# Patient Record
Sex: Female | Born: 1960
Health system: Southern US, Community
[De-identification: ages and names within clinical notes are randomized; demographics above are authoritative.]

## PROBLEM LIST (undated history)

## (undated) DIAGNOSIS — C3490 Malignant neoplasm of unspecified part of unspecified bronchus or lung: Secondary | ICD-10-CM

## (undated) DIAGNOSIS — I10 Essential (primary) hypertension: Secondary | ICD-10-CM

## (undated) DIAGNOSIS — J189 Pneumonia, unspecified organism: Secondary | ICD-10-CM

## (undated) DIAGNOSIS — J42 Unspecified chronic bronchitis: Secondary | ICD-10-CM

## (undated) DIAGNOSIS — J449 Chronic obstructive pulmonary disease, unspecified: Secondary | ICD-10-CM

## (undated) DIAGNOSIS — J939 Pneumothorax, unspecified: Secondary | ICD-10-CM

## (undated) HISTORY — PX: TONSILLECTOMY: SUR1361

## (undated) HISTORY — PX: CHEST TUBE INSERTION: SHX231

## (undated) HISTORY — PX: LUMBAR DISC SURGERY: SHX700

## (undated) HISTORY — PX: BACK SURGERY: SHX140

---

## 1998-12-16 ENCOUNTER — Ambulatory Visit (HOSPITAL_BASED_OUTPATIENT_CLINIC_OR_DEPARTMENT_OTHER): Admission: RE | Admit: 1998-12-16 | Discharge: 1998-12-16 | Payer: Self-pay | Admitting: *Deleted

## 2001-10-06 ENCOUNTER — Encounter: Admission: RE | Admit: 2001-10-06 | Discharge: 2001-10-06 | Payer: Self-pay | Admitting: *Deleted

## 2001-10-06 ENCOUNTER — Encounter: Payer: Self-pay | Admitting: *Deleted

## 2003-09-21 ENCOUNTER — Emergency Department (HOSPITAL_COMMUNITY): Admission: EM | Admit: 2003-09-21 | Discharge: 2003-09-21 | Payer: Self-pay

## 2003-09-22 ENCOUNTER — Emergency Department (HOSPITAL_COMMUNITY): Admission: AD | Admit: 2003-09-22 | Discharge: 2003-09-22 | Payer: Self-pay | Admitting: Family Medicine

## 2003-09-23 ENCOUNTER — Emergency Department (HOSPITAL_COMMUNITY): Admission: AD | Admit: 2003-09-23 | Discharge: 2003-09-23 | Payer: Self-pay | Admitting: Family Medicine

## 2003-09-24 ENCOUNTER — Emergency Department (HOSPITAL_COMMUNITY): Admission: AD | Admit: 2003-09-24 | Discharge: 2003-09-24 | Payer: Self-pay | Admitting: Family Medicine

## 2005-08-13 ENCOUNTER — Emergency Department (HOSPITAL_COMMUNITY): Admission: EM | Admit: 2005-08-13 | Discharge: 2005-08-13 | Payer: Self-pay | Admitting: Family Medicine

## 2006-04-21 ENCOUNTER — Ambulatory Visit (HOSPITAL_COMMUNITY): Admission: RE | Admit: 2006-04-21 | Discharge: 2006-04-22 | Payer: Self-pay | Admitting: Neurosurgery

## 2009-08-08 ENCOUNTER — Other Ambulatory Visit: Admission: RE | Admit: 2009-08-08 | Discharge: 2009-08-08 | Payer: Self-pay | Admitting: Family Medicine

## 2009-08-26 ENCOUNTER — Encounter: Admission: RE | Admit: 2009-08-26 | Discharge: 2009-08-26 | Payer: Self-pay | Admitting: Family Medicine

## 2011-02-19 NOTE — Op Note (Signed)
NAME:  Aimee Lewis, Aimee Lewis NO.:  [id number]   MEDICAL RECORD NO.:  [id number]          PATIENT TYPE:  OIB   LOCATION:  3019                         FACILITY:  [facility]   PHYSICIAN:  Hewitt Shorts, M.D.DATE OF BIRTH:  1961-05-23   DATE OF PROCEDURE:  DATE OF DISCHARGE:                                 OPERATIVE REPORT   PREOPERATIVE DIAGNOSIS:  Left L3-4 extraforaminal lumbar disk herniation.  Lumbar degenerative disk disease.  Lumbar spondylosis.  Lumbar scoliosis and  lumbar radiculopathy.   POSTOPERATIVE DIAGNOSIS:  Left L3-4 extraforaminal lumbar disk herniation.  Lumbar degenerative disk disease.  Lumbar spondylosis.  Lumbar scoliosis and  lumbar radiculopathy.   PROCEDURE:  Left L3-4 extraforaminal microdiskectomy with microdissection.   SURGEON:  Hewitt Shorts, M.D.   Walker ShadowDanielle Dess and [name].   INDICATIONS FOR PROCEDURE:  The patient is a 50 year old woman who presented  with a left lumbar radiculopathy.  She was found to have a large left L3-4  lumbar disk herniation located extraforaminally at L3-4 with significant  compression of the left L3 nerve root. Decision was made to proceed with  extraforaminal microdiskectomy.   PROCEDURE:  The patient was brought to the operating room and placed under  general endotracheal anesthesia.  The patient was turned to a prone  position, lumbar region was prepped with beta and saline solution and draped  in sterile fashion.  The midline was infiltrated with local anesthetic with  epinephrine.  It was actually taken to the L3-4 level and identified and the  midline incision was made and carried down to the subcutaneous tissue.  Bipolar cautery, electrocautery was used to obtain hemostasis.  Dissection  was carried down to the lumbar fascia which was incised on the left side of  the midline to the paraspinous muscles with dissection of the spinous  process of lamina, subperiosteal fashion.  Self-retaining  retractor was  placed and dissection was carried out over the facet complexes laterally and  x-rays taken.  The initial localization was at the L4-5 level and therefore  we dissected up to the L3-4 level and exposed the extraforaminal space.  We  identified the transverse process of L3 and L4.  There was mild splaying of  the left L3-4 facet complex and the microscope was draped and brought into  the field to provide additional magnification, illumination and  visualization.  The decompression was performed using microdissection and  microsurgical technique.  A partial superolateral facetectomy was performed.  The intertransverse muscle and fascial tissues was carefully removed and we  identified the exiting left L3 nerve root which appeared bowed posteriorly  and under compression from the central aspect.  We then dissected  inferomedially exposing the disk herniation which was compressing the left  L3 nerve root.  We incised the remaining fascial fibers over the disk  fragment and we removed several large fragments of disk material and then  carefully dissected with a microhook removing and immobilizing other loose  fragments of disk material.  All of these fragments of disk herniation were  removed and good decompression of the left L3 nerve root  in the  extraforaminal space was achieved.  The wound was irrigated on several  occasions with bacitracin solution.  It was checked for hemostasis and was  established using bipolar cautery as well as Gelfoam soaked in thrombin.  Once the diskectomy was completed the hemostasis was established we  instilled 2 cc of fentanyl and 80 mg of Depo-Medrol into the extraforaminal  space around the dorsal ganglion and nerve root and then proceeded with  closure. The deep fascia was closed with interrupted undyed 1 Vicryl suture,  the subcutaneous and subcuticular were closed with interrupted inverted 2-0  and 3-0 Vicryl sutures  and the skin was  reapproximated with Dermabond.  The procedure was tolerated  well.  Estimated blood loss was less than 50 cc.  Sponge count correct.  Following surgery the patient was turned back to supine position to be  reversed of anesthetic, extubated and transferred to the  recovery room for  further care.      Hewitt Shorts, M.D.  Electronically Signed     RWN/MEDQ  D:  04/21/2006  T:  04/22/2006  Job:  [id number]

## 2011-09-14 ENCOUNTER — Other Ambulatory Visit (HOSPITAL_COMMUNITY)
Admission: RE | Admit: 2011-09-14 | Discharge: 2011-09-14 | Disposition: A | Discharge status: Home / Self Care | Payer: 59 | Source: Ambulatory Visit | Attending: Family Medicine | Admitting: Family Medicine

## 2011-09-14 ENCOUNTER — Other Ambulatory Visit: Payer: Self-pay | Admitting: Family Medicine

## 2011-09-14 DIAGNOSIS — Z124 Encounter for screening for malignant neoplasm of cervix: Secondary | ICD-10-CM | POA: Insufficient documentation

## 2011-09-14 DIAGNOSIS — Z1159 Encounter for screening for other viral diseases: Secondary | ICD-10-CM | POA: Insufficient documentation

## 2013-10-09 ENCOUNTER — Other Ambulatory Visit: Payer: Self-pay

## 2013-10-09 DIAGNOSIS — Z1231 Encounter for screening mammogram for malignant neoplasm of breast: Secondary | ICD-10-CM

## 2013-10-11 ENCOUNTER — Ambulatory Visit
Admission: RE | Admit: 2013-10-11 | Discharge: 2013-10-11 | Disposition: A | Discharge status: Home / Self Care | Payer: Self-pay | Source: Ambulatory Visit

## 2013-10-11 DIAGNOSIS — Z1231 Encounter for screening mammogram for malignant neoplasm of breast: Secondary | ICD-10-CM

## 2013-10-15 ENCOUNTER — Other Ambulatory Visit (HOSPITAL_COMMUNITY)
Admission: RE | Admit: 2013-10-15 | Discharge: 2013-10-15 | Disposition: A | Discharge status: Home / Self Care | Payer: 59 | Source: Ambulatory Visit | Attending: Family Medicine | Admitting: Family Medicine

## 2013-10-15 ENCOUNTER — Other Ambulatory Visit: Payer: Self-pay | Admitting: Family Medicine

## 2013-10-15 DIAGNOSIS — Z1151 Encounter for screening for human papillomavirus (HPV): Secondary | ICD-10-CM | POA: Insufficient documentation

## 2013-10-15 DIAGNOSIS — Z124 Encounter for screening for malignant neoplasm of cervix: Secondary | ICD-10-CM | POA: Insufficient documentation

## 2014-08-10 ENCOUNTER — Emergency Department (HOSPITAL_COMMUNITY)
Admission: EM | Admit: 2014-08-10 | Discharge: 2014-08-10 | Disposition: A | Discharge status: Home / Self Care | Payer: 59 | Attending: Emergency Medicine | Admitting: Emergency Medicine

## 2014-08-10 ENCOUNTER — Emergency Department (HOSPITAL_COMMUNITY): Payer: 59

## 2014-08-10 ENCOUNTER — Encounter (HOSPITAL_COMMUNITY): Payer: Self-pay | Admitting: Emergency Medicine

## 2014-08-10 DIAGNOSIS — Y9389 Activity, other specified: Secondary | ICD-10-CM | POA: Diagnosis not present

## 2014-08-10 DIAGNOSIS — W010XXA Fall on same level from slipping, tripping and stumbling without subsequent striking against object, initial encounter: Secondary | ICD-10-CM

## 2014-08-10 DIAGNOSIS — Z720 Tobacco use: Secondary | ICD-10-CM | POA: Insufficient documentation

## 2014-08-10 DIAGNOSIS — S4991XA Unspecified injury of right shoulder and upper arm, initial encounter: Secondary | ICD-10-CM | POA: Insufficient documentation

## 2014-08-10 DIAGNOSIS — S2231XA Fracture of one rib, right side, initial encounter for closed fracture: Secondary | ICD-10-CM | POA: Diagnosis not present

## 2014-08-10 DIAGNOSIS — R52 Pain, unspecified: Secondary | ICD-10-CM

## 2014-08-10 DIAGNOSIS — Z79899 Other long term (current) drug therapy: Secondary | ICD-10-CM | POA: Diagnosis not present

## 2014-08-10 DIAGNOSIS — W109XXA Fall (on) (from) unspecified stairs and steps, initial encounter: Secondary | ICD-10-CM | POA: Diagnosis not present

## 2014-08-10 DIAGNOSIS — Y92009 Unspecified place in unspecified non-institutional (private) residence as the place of occurrence of the external cause: Secondary | ICD-10-CM | POA: Diagnosis not present

## 2014-08-10 DIAGNOSIS — E119 Type 2 diabetes mellitus without complications: Secondary | ICD-10-CM | POA: Insufficient documentation

## 2014-08-10 DIAGNOSIS — S3992XA Unspecified injury of lower back, initial encounter: Secondary | ICD-10-CM | POA: Diagnosis not present

## 2014-08-10 MED ORDER — ONDANSETRON 8 MG PO TBDP: 8.0000 mg | ORAL_TABLET | Freq: Three times a day (TID) | ORAL | Status: DC | PRN

## 2014-08-10 MED ORDER — IBUPROFEN 200 MG PO TABS
400.0000 mg | ORAL_TABLET | Freq: Once | ORAL | Status: AC
  Administered 2014-08-10: 400 mg via ORAL
  Filled 2014-08-10: qty 2

## 2014-08-10 MED ORDER — ONDANSETRON 8 MG PO TBDP
8.0000 mg | ORAL_TABLET | Freq: Once | ORAL | Status: AC
  Administered 2014-08-10: 8 mg via ORAL
  Filled 2014-08-10: qty 1

## 2014-08-10 MED ORDER — HYDROCODONE-ACETAMINOPHEN 5-325 MG PO TABS: 1.0000 | ORAL_TABLET | Freq: Four times a day (QID) | ORAL | Status: DC | PRN

## 2014-08-10 MED ORDER — HYDROCODONE-ACETAMINOPHEN 5-325 MG PO TABS
2.0000 | ORAL_TABLET | Freq: Once | ORAL | Status: AC
  Administered 2014-08-10: 2 via ORAL
  Filled 2014-08-10: qty 2

## 2014-08-10 NOTE — ED Notes (Signed)
Pt from home reports fall down three steps last pm after tripping. Pt c/o R shoulder and R rib pain. Pt denies other  Injury or LOC. Pt is A&O and in NAD. Pt rates R shoulder pain 7/10 and R flank area is having muscles spasms.

## 2014-08-10 NOTE — Discharge Instructions (Signed)
It was our pleasure to provide your ER care today - we hope that you feel better.  Stay active, take full and deep breaths. Take motrin or aleve as need for pain. You may also take hydrocodone as need for pain. No driving when taking hydrocodone. Also, do not take tylenol or acetaminophen containing medication when taking hydrocodone. Take zofran as need for nausea.  Your xrays were read by our radiologist as showing:  IMPRESSION: 1. Acute displaced fracture of the posterolateral aspect of the right sixth rib. 2. No evidence of a pneumothorax or other acute cardiopulmonary process. 3. 2.9 cm bubbly sclerotic lesion centered within the right proximal humeral metadiaphysis is. In the absence of clinical symptoms this is almost certainly a benign enchondroma or other fibro-osseous Lesion.  For bony lesion proximal humerus, discuss with primary care doctor, and follow up with your doctor in 3-6 months for repeat xray.  You were given pain medication in the ER - no driving for the next 4 hours.      Rib Fracture   A rib fracture is a break or crack in one of the bones of the ribs. The ribs are a group of long, curved bones that wrap around your chest and attach to your spine. They protect your lungs and other organs in the chest cavity. A broken or cracked rib is often painful, but most do not cause other problems. Most rib fractures heal on their own over time. However, rib fractures can be more serious if multiple ribs are broken or if broken ribs move out of place and push against other structures. CAUSES   A direct blow to the chest. For example, this could happen during contact sports, a car accident, or a fall against a hard object.  Repetitive movements with high force, such as pitching a baseball or having severe coughing spells. SYMPTOMS   Pain when you breathe in or cough.  Pain when someone presses on the injured area. DIAGNOSIS  Your caregiver will perform a physical  exam. Various imaging tests may be ordered to confirm the diagnosis and to look for related injuries. These tests may include a chest X-ray, computed tomography (CT), magnetic resonance imaging (MRI), or a bone scan. TREATMENT  Rib fractures usually heal on their own in 1-3 months. The longer healing period is often associated with a continued cough or other aggravating activities. During the healing period, pain control is very important. Medication is usually given to control pain. Hospitalization or surgery may be needed for more severe injuries, such as those in which multiple ribs are broken or the ribs have moved out of place.  HOME CARE INSTRUCTIONS   Avoid strenuous activity and any activities or movements that cause pain. Be careful during activities and avoid bumping the injured rib.  Gradually increase activity as directed by your caregiver.  Only take over-the-counter or prescription medications as directed by your caregiver. Do not take other medications without asking your caregiver first.  Apply ice to the injured area for the first 1-2 days after you have been treated or as directed by your caregiver. Applying ice helps to reduce inflammation and pain.  Put ice in a plastic bag.  Place a towel between your skin and the bag.   Leave the ice on for 15-20 minutes at a time, every 2 hours while you are awake.  Perform deep breathing as directed by your caregiver. This will help prevent pneumonia, which is a common complication of a broken rib.  Your caregiver may instruct you to:  Take deep breaths several times a day.  Try to cough several times a day, holding a pillow against the injured area.  Use a device called an incentive spirometer to practice deep breathing several times a day.  Drink enough fluids to keep your urine clear or pale yellow. This will help you avoid constipation.   Do not wear a rib belt or binder. These restrict breathing, which can lead to pneumonia.   SEEK IMMEDIATE MEDICAL CARE IF:   You have a fever.   You have difficulty breathing or shortness of breath.   You develop a continual cough, or you cough up thick or bloody sputum.  You feel sick to your stomach (nausea), throw up (vomit), or have abdominal pain.   You have worsening pain not controlled with medications.  MAKE SURE YOU:  Understand these instructions.  Will watch your condition.  Will get help right away if you are not doing well or get worse. Document Released: 09/20/2005 Document Revised: 05/23/2013 Document Reviewed: 11/22/2012 [facility] Patient Information 2015 [place], [place]. This information is not intended to replace advice given to you by your health care provider. Make sure you discuss any questions you have with your health care provider.

## 2014-08-10 NOTE — ED Provider Notes (Signed)
CSN: [id number]     Arrival date & time 08/10/14  8657 History   First MD Initiated Contact with Patient 08/10/14 0732     Chief Complaint  Patient presents with  . Fall  . Flank Pain  . Shoulder Pain     (Consider location/radiation/quality/duration/timing/severity/associated sxs/prior Treatment) Patient is a 53 y.o. female presenting with fall, flank pain, and shoulder pain. The history is provided by the patient.  Fall Pertinent negatives include no chest pain, no abdominal pain, no headaches and no shortness of breath.  Flank Pain Pertinent negatives include no chest pain, no abdominal pain, no headaches and no shortness of breath.  Shoulder Pain Associated symptoms: back pain   Associated symptoms: no fever and no neck pain   pt c/o trip/slip and fall on steps at home last pm. [place] [address] onto back side. C/o right mid to upper back pain laterally. No midline pain. No neck pain. No radicular pain. No numbness/weakness. Denies any faintness or dizziness prior to fall, felt normal/fine, mechanical fall. No loc w fall. No head injury or headache. No nv. No cp or sob. No abd pain or nv. Denies extremity pain or injury. Skin intact.     Past Medical History  Diagnosis Date  . Diabetes mellitus without complication    Past Surgical History  Procedure Laterality Date  . Tonsillectomy    . Back surgery     No family history on file. History  Substance Use Topics  . Smoking status: Current Every Day Smoker -- 1.00 packs/day    Types: Cigarettes  . Smokeless tobacco: Not on file  . Alcohol Use: Yes     Comment: socially   OB History    No data available     Review of Systems  Constitutional: Negative for fever and chills.  HENT: Negative for nosebleeds.   Eyes: Negative for visual disturbance.  Respiratory: Negative for shortness of breath.   Cardiovascular: Negative for chest pain.  Gastrointestinal: Negative for vomiting and abdominal pain.  Genitourinary: Negative for  dysuria and hematuria.  Musculoskeletal: Positive for back pain. Negative for neck pain.  Skin: Negative for wound.  Neurological: Negative for syncope, weakness, numbness and headaches.  Hematological: Does not bruise/bleed easily.  Psychiatric/Behavioral: Negative for confusion.      Allergies  Codeine and Darvon  Home Medications   Prior to Admission medications   Medication Sig Start Date End Date Taking? Authorizing Provider  lisinopril (PRINIVIL,ZESTRIL) 40 MG tablet Take 40 mg by mouth daily.   Yes Historical Provider, MD   BP 144/86 mmHg  Pulse 98  Temp(Src) 98.6 F (37 C) (Oral)  Resp 20  SpO2 95% Physical Exam  Constitutional: She is oriented to person, place, and time. She appears well-developed and well-nourished. No distress.  HENT:  Head: Atraumatic.  Eyes: Conjunctivae are normal. Pupils are equal, round, and reactive to light. No scleral icterus.  Neck: Normal range of motion. Neck supple. No tracheal deviation present.  Cardiovascular: Normal rate, regular rhythm, normal heart sounds and intact distal pulses.   Pulmonary/Chest: Effort normal and breath sounds normal. No respiratory distress. She exhibits no tenderness.  Abdominal: Soft. Normal appearance and bowel sounds are normal. She exhibits no distension. There is no tenderness.  Genitourinary:  No cva tenderness  Musculoskeletal: She exhibits no edema.  Right thoracic pain laterally with right posterior rib tenderness, no crepitus. CTLS spine, non tender, aligned, no step off. Good rom bil ext, incl right shoulder, without pain or focal bony tenderness.  Neurological: She is alert and oriented to person, place, and time.  Motor intact bil. stre 5/5, steady gait.  Skin: Skin is warm and dry. No rash noted. She is not diaphoretic.  Skin intact.   Psychiatric: She has a normal mood and affect.  Nursing note and vitals reviewed.   ED Course  Procedures (including critical care time) Labs Review Dg  Ribs Unilateral W/chest Right  08/10/2014   CLINICAL DATA:  53 year old female with pain in posterior right ribs after falling backward down 3 stairs in her home last night. Initial encounter.  EXAM: RIGHT RIBS AND CHEST - 3+ VIEW  COMPARISON:  Prior chest x-ray 07/05/2012  FINDINGS: The cardiac and mediastinal contours are normal. The lungs are clear. Trace bite apical pleural parenchymal scarring. No pneumothorax or pleural effusion.  Mildly displaced fracture of the posterolateral aspect of the right sixth rib. Rotary in dextro convex scoliosis of the thoracolumbar spine centered at L1. Bubbly 2.9 cm sclerotic lesion partially in the proximal humeral metadiaphysis is. This is very likely a benign enchondroma.  IMPRESSION: 1. Acute displaced fracture of the posterolateral aspect of the right sixth rib. 2. No evidence of a pneumothorax or other acute cardiopulmonary process. 3. 2.9 cm bubbly sclerotic lesion centered within the right proximal humeral metadiaphysis is. In the absence of clinical symptoms this is almost certainly a benign enchondroma or other fibro-osseous lesion.   Electronically Signed   By: Jacqulynn Cadet M.D.   On: 08/10/2014 08:41      MDM  Xrays.  Pt states rx to codeine and darvon is nausea.  zofran po.  Hydrocodone po. Motrin po.  Reviewed nursing notes and prior charts for additional history.   Discussed rib fx with pt, pt breathing comfortably, pulse ox 95%.  Also discussed prox humerus lesion on xray and need for pcp follow up.  Pt appears stable for d/c.    Mirna Mires, MD 08/10/14 [phone]

## 2016-03-16 ENCOUNTER — Other Ambulatory Visit: Payer: Self-pay | Admitting: Family Medicine

## 2016-03-16 DIAGNOSIS — Z1231 Encounter for screening mammogram for malignant neoplasm of breast: Secondary | ICD-10-CM

## 2016-05-05 ENCOUNTER — Ambulatory Visit
Admission: RE | Admit: 2016-05-05 | Discharge: 2016-05-05 | Disposition: A | Discharge status: Home / Self Care | Payer: BLUE CROSS/BLUE SHIELD | Source: Ambulatory Visit | Attending: Family Medicine | Admitting: Family Medicine

## 2016-05-05 DIAGNOSIS — Z1231 Encounter for screening mammogram for malignant neoplasm of breast: Secondary | ICD-10-CM | POA: Diagnosis not present

## 2016-06-11 DIAGNOSIS — R233 Spontaneous ecchymoses: Secondary | ICD-10-CM | POA: Diagnosis not present

## 2016-06-11 DIAGNOSIS — L720 Epidermal cyst: Secondary | ICD-10-CM | POA: Diagnosis not present

## 2016-10-11 DIAGNOSIS — M7541 Impingement syndrome of right shoulder: Secondary | ICD-10-CM | POA: Diagnosis not present

## 2016-10-11 DIAGNOSIS — M65311 Trigger thumb, right thumb: Secondary | ICD-10-CM | POA: Diagnosis not present

## 2016-10-15 DIAGNOSIS — M7541 Impingement syndrome of right shoulder: Secondary | ICD-10-CM | POA: Diagnosis not present

## 2016-11-21 DIAGNOSIS — L089 Local infection of the skin and subcutaneous tissue, unspecified: Secondary | ICD-10-CM | POA: Diagnosis not present

## 2016-11-21 DIAGNOSIS — T148XXA Other injury of unspecified body region, initial encounter: Secondary | ICD-10-CM | POA: Diagnosis not present

## 2016-11-25 DIAGNOSIS — E785 Hyperlipidemia, unspecified: Secondary | ICD-10-CM | POA: Diagnosis not present

## 2016-11-25 DIAGNOSIS — I10 Essential (primary) hypertension: Secondary | ICD-10-CM | POA: Diagnosis not present

## 2016-11-29 DIAGNOSIS — L723 Sebaceous cyst: Secondary | ICD-10-CM | POA: Diagnosis not present

## 2016-12-10 DIAGNOSIS — R799 Abnormal finding of blood chemistry, unspecified: Secondary | ICD-10-CM | POA: Diagnosis not present

## 2016-12-10 DIAGNOSIS — E785 Hyperlipidemia, unspecified: Secondary | ICD-10-CM | POA: Diagnosis not present

## 2016-12-20 DIAGNOSIS — L720 Epidermal cyst: Secondary | ICD-10-CM | POA: Diagnosis not present

## 2017-02-16 DIAGNOSIS — M65311 Trigger thumb, right thumb: Secondary | ICD-10-CM | POA: Diagnosis not present

## 2017-03-18 DIAGNOSIS — M65311 Trigger thumb, right thumb: Secondary | ICD-10-CM | POA: Diagnosis not present

## 2017-04-01 ENCOUNTER — Other Ambulatory Visit: Payer: Self-pay | Admitting: Physician Assistant

## 2017-04-01 ENCOUNTER — Other Ambulatory Visit (HOSPITAL_COMMUNITY)
Admission: RE | Admit: 2017-04-01 | Discharge: 2017-04-01 | Disposition: A | Discharge status: Home / Self Care | Payer: BLUE CROSS/BLUE SHIELD | Source: Ambulatory Visit | Attending: Physician Assistant | Admitting: Physician Assistant

## 2017-04-01 DIAGNOSIS — Z0000 Encounter for general adult medical examination without abnormal findings: Secondary | ICD-10-CM | POA: Diagnosis not present

## 2017-04-01 DIAGNOSIS — Z124 Encounter for screening for malignant neoplasm of cervix: Secondary | ICD-10-CM | POA: Diagnosis not present

## 2017-04-01 DIAGNOSIS — R938 Abnormal findings on diagnostic imaging of other specified body structures: Secondary | ICD-10-CM | POA: Diagnosis not present

## 2017-04-01 DIAGNOSIS — Z1231 Encounter for screening mammogram for malignant neoplasm of breast: Secondary | ICD-10-CM

## 2017-04-06 LAB — CYTOLOGY - PAP
Diagnosis: NEGATIVE
HPV: NOT DETECTED

## 2017-04-22 DIAGNOSIS — M65311 Trigger thumb, right thumb: Secondary | ICD-10-CM | POA: Diagnosis not present

## 2017-06-20 DIAGNOSIS — H25013 Cortical age-related cataract, bilateral: Secondary | ICD-10-CM | POA: Diagnosis not present

## 2017-06-20 DIAGNOSIS — H2511 Age-related nuclear cataract, right eye: Secondary | ICD-10-CM | POA: Diagnosis not present

## 2017-06-20 DIAGNOSIS — H2513 Age-related nuclear cataract, bilateral: Secondary | ICD-10-CM | POA: Diagnosis not present

## 2017-07-25 DIAGNOSIS — H2511 Age-related nuclear cataract, right eye: Secondary | ICD-10-CM | POA: Diagnosis not present

## 2017-09-03 HISTORY — PX: CATARACT EXTRACTION W/ INTRAOCULAR LENS  IMPLANT, BILATERAL: SHX1307

## 2017-09-13 DIAGNOSIS — H25811 Combined forms of age-related cataract, right eye: Secondary | ICD-10-CM | POA: Diagnosis not present

## 2017-09-13 DIAGNOSIS — H2511 Age-related nuclear cataract, right eye: Secondary | ICD-10-CM | POA: Diagnosis not present

## 2017-09-14 DIAGNOSIS — H2512 Age-related nuclear cataract, left eye: Secondary | ICD-10-CM | POA: Diagnosis not present

## 2017-09-20 DIAGNOSIS — H25812 Combined forms of age-related cataract, left eye: Secondary | ICD-10-CM | POA: Diagnosis not present

## 2017-09-20 DIAGNOSIS — H2512 Age-related nuclear cataract, left eye: Secondary | ICD-10-CM | POA: Diagnosis not present

## 2017-09-22 DIAGNOSIS — R7301 Impaired fasting glucose: Secondary | ICD-10-CM | POA: Diagnosis not present

## 2017-09-22 DIAGNOSIS — I10 Essential (primary) hypertension: Secondary | ICD-10-CM | POA: Diagnosis not present

## 2017-09-22 DIAGNOSIS — E785 Hyperlipidemia, unspecified: Secondary | ICD-10-CM | POA: Diagnosis not present

## 2017-09-29 DIAGNOSIS — I10 Essential (primary) hypertension: Secondary | ICD-10-CM | POA: Diagnosis not present

## 2017-09-29 DIAGNOSIS — R7303 Prediabetes: Secondary | ICD-10-CM | POA: Diagnosis not present

## 2017-09-29 DIAGNOSIS — J449 Chronic obstructive pulmonary disease, unspecified: Secondary | ICD-10-CM | POA: Diagnosis not present

## 2017-09-29 DIAGNOSIS — E785 Hyperlipidemia, unspecified: Secondary | ICD-10-CM | POA: Diagnosis not present

## 2017-10-03 ENCOUNTER — Ambulatory Visit
Admission: RE | Admit: 2017-10-03 | Discharge: 2017-10-03 | Disposition: A | Discharge status: Home / Self Care | Payer: BLUE CROSS/BLUE SHIELD | Source: Ambulatory Visit | Attending: Physician Assistant | Admitting: Physician Assistant

## 2017-10-03 DIAGNOSIS — Z1231 Encounter for screening mammogram for malignant neoplasm of breast: Secondary | ICD-10-CM

## 2017-10-11 DIAGNOSIS — J181 Lobar pneumonia, unspecified organism: Secondary | ICD-10-CM | POA: Diagnosis not present

## 2017-10-11 DIAGNOSIS — R05 Cough: Secondary | ICD-10-CM | POA: Diagnosis not present

## 2017-10-11 DIAGNOSIS — R0602 Shortness of breath: Secondary | ICD-10-CM | POA: Diagnosis not present

## 2017-10-11 DIAGNOSIS — J449 Chronic obstructive pulmonary disease, unspecified: Secondary | ICD-10-CM | POA: Diagnosis not present

## 2017-11-01 DIAGNOSIS — J181 Lobar pneumonia, unspecified organism: Secondary | ICD-10-CM | POA: Diagnosis not present

## 2017-11-07 ENCOUNTER — Other Ambulatory Visit: Payer: Self-pay | Admitting: Family Medicine

## 2017-11-07 DIAGNOSIS — R918 Other nonspecific abnormal finding of lung field: Secondary | ICD-10-CM

## 2017-11-22 ENCOUNTER — Ambulatory Visit
Admission: RE | Admit: 2017-11-22 | Discharge: 2017-11-22 | Disposition: A | Discharge status: Home / Self Care | Payer: BLUE CROSS/BLUE SHIELD | Source: Ambulatory Visit | Attending: Family Medicine | Admitting: Family Medicine

## 2017-11-22 DIAGNOSIS — R918 Other nonspecific abnormal finding of lung field: Secondary | ICD-10-CM

## 2017-11-22 DIAGNOSIS — J439 Emphysema, unspecified: Secondary | ICD-10-CM | POA: Diagnosis not present

## 2017-11-22 MED ORDER — IOPAMIDOL (ISOVUE-300) INJECTION 61%
75.0000 mL | Freq: Once | INTRAVENOUS | Status: AC | PRN
  Administered 2017-11-22: 75 mL via INTRAVENOUS

## 2017-12-07 ENCOUNTER — Other Ambulatory Visit (HOSPITAL_COMMUNITY): Payer: Self-pay | Admitting: Family Medicine

## 2017-12-07 DIAGNOSIS — R918 Other nonspecific abnormal finding of lung field: Secondary | ICD-10-CM

## 2017-12-24 DIAGNOSIS — J019 Acute sinusitis, unspecified: Secondary | ICD-10-CM | POA: Diagnosis not present

## 2017-12-26 ENCOUNTER — Ambulatory Visit (HOSPITAL_COMMUNITY)
Admission: RE | Admit: 2017-12-26 | Discharge: 2017-12-26 | Disposition: A | Discharge status: Home / Self Care | Payer: BLUE CROSS/BLUE SHIELD | Source: Ambulatory Visit | Attending: Family Medicine | Admitting: Family Medicine

## 2017-12-26 DIAGNOSIS — J438 Other emphysema: Secondary | ICD-10-CM | POA: Diagnosis not present

## 2017-12-26 DIAGNOSIS — I2510 Atherosclerotic heart disease of native coronary artery without angina pectoris: Secondary | ICD-10-CM | POA: Insufficient documentation

## 2017-12-26 DIAGNOSIS — J432 Centrilobular emphysema: Secondary | ICD-10-CM | POA: Insufficient documentation

## 2017-12-26 DIAGNOSIS — J439 Emphysema, unspecified: Secondary | ICD-10-CM | POA: Diagnosis not present

## 2017-12-26 DIAGNOSIS — I358 Other nonrheumatic aortic valve disorders: Secondary | ICD-10-CM | POA: Diagnosis not present

## 2017-12-26 DIAGNOSIS — I700 Atherosclerosis of aorta: Secondary | ICD-10-CM | POA: Diagnosis not present

## 2017-12-26 DIAGNOSIS — R918 Other nonspecific abnormal finding of lung field: Secondary | ICD-10-CM | POA: Diagnosis not present

## 2017-12-26 LAB — GLUCOSE, CAPILLARY: Glucose-Capillary: 77 mg/dL (ref 65–99)

## 2017-12-26 MED ORDER — FLUDEOXYGLUCOSE F - 18 (FDG) INJECTION
6.2000 | Freq: Once | INTRAVENOUS | Status: AC | PRN
  Administered 2017-12-26: 6.2 via INTRAVENOUS

## 2018-01-02 ENCOUNTER — Ambulatory Visit (INDEPENDENT_AMBULATORY_CARE_PROVIDER_SITE_OTHER): Payer: BLUE CROSS/BLUE SHIELD | Admitting: Pulmonary Disease

## 2018-01-02 ENCOUNTER — Encounter: Payer: Self-pay | Admitting: Pulmonary Disease

## 2018-01-02 ENCOUNTER — Other Ambulatory Visit (INDEPENDENT_AMBULATORY_CARE_PROVIDER_SITE_OTHER): Payer: BLUE CROSS/BLUE SHIELD

## 2018-01-02 VITALS — BP 126/82 | HR 99

## 2018-01-02 DIAGNOSIS — F17210 Nicotine dependence, cigarettes, uncomplicated: Secondary | ICD-10-CM | POA: Diagnosis not present

## 2018-01-02 DIAGNOSIS — Z01812 Encounter for preprocedural laboratory examination: Secondary | ICD-10-CM

## 2018-01-02 DIAGNOSIS — R918 Other nonspecific abnormal finding of lung field: Secondary | ICD-10-CM | POA: Diagnosis not present

## 2018-01-02 LAB — BASIC METABOLIC PANEL
BUN: 17 mg/dL (ref 6–23)
CO2: 31 mEq/L (ref 19–32)
Calcium: 10.2 mg/dL (ref 8.4–10.5)
Chloride: 95 mEq/L — ABNORMAL LOW (ref 96–112)
Creatinine, Ser: 0.76 mg/dL (ref 0.40–1.20)
GFR: 83.09 mL/min (ref >60.00)
Glucose, Bld: 98 mg/dL (ref 70–99)
Potassium: 4.4 mEq/L (ref 3.5–5.1)
Sodium: 134 mEq/L — ABNORMAL LOW (ref 135–145)

## 2018-01-02 LAB — CBC WITH DIFFERENTIAL/PLATELET
Basophils Absolute: 0.1 10*3/uL (ref 0.0–0.1)
Basophils Relative: 1.3 % (ref 0.0–3.0)
Eosinophils Absolute: 0.1 10*3/uL (ref 0.0–0.7)
Eosinophils Relative: 2.4 % (ref 0.0–5.0)
HCT: 41.3 % (ref 36.0–46.0)
Hemoglobin: 13.9 g/dL (ref 12.0–15.0)
Lymphocytes Relative: 36.1 % (ref 12.0–46.0)
Lymphs Abs: 1.8 10*3/uL (ref 0.7–4.0)
MCHC: 33.6 g/dL (ref 30.0–36.0)
MCV: 99.7 fl (ref 78.0–100.0)
Monocytes Absolute: 0.5 10*3/uL (ref 0.1–1.0)
Monocytes Relative: 10.2 % (ref 3.0–12.0)
Neutro Abs: 2.5 10*3/uL (ref 1.4–7.7)
Neutrophils Relative %: 50 % (ref 43.0–77.0)
Platelets: 366 10*3/uL (ref 150.0–400.0)
RBC: 4.15 Mil/uL (ref 3.87–5.11)
RDW: 13.9 % (ref 11.5–15.5)
WBC: 5 10*3/uL (ref 4.0–10.5)

## 2018-01-02 LAB — PROTIME-INR
INR: 1 ratio (ref 0.8–1.0)
Prothrombin Time: 11.1 s (ref 9.6–13.1)

## 2018-01-02 NOTE — Patient Instructions (Signed)
Tobacco abuse: You should stop smoking right away Please review the smoking cessation sheet we gave you in clinic today  Left upper lobe lesion: As we discussed today, I am not sure what this is.  It could be resolving pneumonia but given your smoking history we need to rule out malignancy. We will make arrangements for something called a navigational bronchoscopy with Dr. Baltazar Apo.  Our office will make arrangements for this.  We will see you back in 2-4 weeks or sooner if needed

## 2018-01-02 NOTE — H&P (View-Only) (Signed)
Subjective:    Patient ID: ASJIA BERRIOS, female    DOB: 04-Jan-1961, 57 y.o.   MRN: [id number]  Synopsis: Referred in April 2019 for evaluation of an abnormal PET scan per  HPI Chief Complaint  Patient presents with  . Consult    referred by Dr. Lynelle Doctor for lung spot   Geanette had pneumonia recently and had a CT scan to evaluate further.   > she was diagnosed with pneumonia around January > she ahda severe cough with lots of phlegm > she had pneumonia many years ago when her son was 6 weeks OK, this was 34 years > she has had bronchitis in the past > she now feels well > she is no longer producing > her weight has been up and down > no chest pain  She tells me that her pneumonia symptoms actually lasted for about 6 weeks.  She took over 30 days of antibiotics before the symptoms improved.  She smoked 1ppd and still smokes.  Started smoking in college.  Her mother had emphysema and her father had lung cancer.  She works in a [employer] as an Web designer.   Past Medical History:  Diagnosis Date  . Diabetes mellitus without complication ([place])      History reviewed. No pertinent family history.   Social History   Socioeconomic History  . Marital status: Married    Spouse name: Not on file  . Number of children: Not on file  . Years of education: Not on file  . Highest education level: Not on file  Occupational History  . Not on file  Social Needs  . Financial resource strain: Not on file  . Food insecurity:    Worry: Not on file    Inability: Not on file  . Transportation needs:    Medical: Not on file    Non-medical: Not on file  Tobacco Use  . Smoking status: Current Every Day Smoker    Packs/day: 1.00    Types: Cigarettes  . Smokeless tobacco: Former Network engineer and Sexual Activity  . Alcohol use: Yes    Comment: socially  . Drug use: No  . Sexual activity: Not on file  Lifestyle  . Physical activity:    Days per week:  Not on file    Minutes per session: Not on file  . Stress: Not on file  Relationships  . Social connections:    Talks on phone: Not on file    Gets together: Not on file    Attends religious service: Not on file    Active member of club or organization: Not on file    Attends meetings of clubs or organizations: Not on file    Relationship status: Not on file  . Intimate partner violence:    Fear of current or ex partner: Not on file    Emotionally abused: Not on file    Physically abused: Not on file    Forced sexual activity: Not on file  Other Topics Concern  . Not on file  Social History Narrative  . Not on file     Allergies  Allergen Reactions  . Codeine Nausea And Vomiting  . Darvon [Propoxyphene] Nausea And Vomiting     Outpatient Medications Prior to Visit  Medication Sig Dispense Refill  . lisinopril (PRINIVIL,ZESTRIL) 40 MG tablet Take 40 mg by mouth daily.    Marland Kitchen HYDROcodone-acetaminophen (NORCO/VICODIN) 5-325 MG per tablet Take 1-2 tablets by mouth every 6 (six)  hours as needed for moderate pain. 30 tablet 0  . ondansetron (ZOFRAN ODT) 8 MG disintegrating tablet Take 1 tablet (8 mg total) by mouth every 8 (eight) hours as needed for nausea or vomiting. 10 tablet 0   No facility-administered medications prior to visit.       Review of Systems  Constitutional: Negative for fever and unexpected weight change.  HENT: Negative for congestion, dental problem, ear pain, nosebleeds, postnasal drip, rhinorrhea, sinus pressure, sneezing, sore throat and trouble swallowing.   Eyes: Negative for redness and itching.  Respiratory: Negative for cough, chest tightness, shortness of breath and wheezing.   Cardiovascular: Negative for palpitations and leg swelling.  Gastrointestinal: Negative for nausea and vomiting.  Genitourinary: Negative for dysuria.  Musculoskeletal: Negative for joint swelling.  Skin: Negative for rash.  Allergic/Immunologic: Negative.  Negative for  environmental allergies, food allergies and immunocompromised state.  Neurological: Negative for headaches.  Hematological: Does not bruise/bleed easily.  Psychiatric/Behavioral: Negative for dysphoric mood. The patient is not nervous/anxious.        Objective:   Physical Exam  Vitals:   01/02/18 1426  BP: 126/82  Pulse: 99  SpO2: (!) 87%  RA  Gen: well appearing, no acute distress HENT: NCAT, OP clear, neck supple without masses Eyes: PERRL, EOMi Lymph: no cervical lymphadenopathy PULM: CTA B CV: RRR, no mgr, no JVD GI: BS+, soft, nontender, no hsm Derm: no rash or skin breakdown MSK: normal bulk and tone Neuro: A&Ox4, CN II-XII intact, strength 5/5 in all 4 extremities Psyche: normal mood and affect   February 2019 CT chest images independently reviewed showing an oblong 4 cm x 1.8 cm glass nodule in the left upper lobe which appears to be subsegmental March 2019 PET/CT shows essentially no change in the size of the lesion described above, low-level PET positive       Assessment & Plan:   No diagnosis found.  Discussion: This is a pleasant 57 year old smoking female who has a persistent lesion seen on the left upper lobe CT scan and PET scanning several weeks after resolution of community-acquired pneumonia.  Given the duration of time after pneumonia treatment and the lack of pneumonia symptoms think that it is best that we sample this to rule out malignancy.  Given her smoking history she is at increased risk for malignancy.  The differential diagnosis of this lesion is resolving pneumonia or scarring from a severe case of pneumonia, but I think her risk category is too high to sit and watch this with serial imaging.  We discussed this at length today.  We talked about different options including resection of the lesion, navigational bronchoscopy of the lesion, and serial imaging.  After discussion she has elected to undergo a navigational bronchoscopy for a  diagnosis.  Plan: Tobacco abuse: You should stop smoking right away Please review the smoking cessation sheet we gave you in clinic today  Left upper lobe lesion: As we discussed today, I am not sure what this is.  It could be resolving pneumonia but given your smoking history we need to rule out malignancy. We will make arrangements for something called a navigational bronchoscopy with Dr. Baltazar Apo.  Our office will make arrangements for this.  We will see you back in 2-4 weeks or sooner if needed   Current Outpatient Medications:  .  lisinopril (PRINIVIL,ZESTRIL) 40 MG tablet, Take 40 mg by mouth daily., Disp: , Rfl:

## 2018-01-02 NOTE — Progress Notes (Signed)
Subjective:    Patient ID: Aimee Lewis, female    DOB: 03-28-1961, 57 y.o.   MRN: [id number]  Synopsis: Referred in April 2019 for evaluation of an abnormal PET scan per  HPI Chief Complaint  Patient presents with  . Consult    referred by Dr. Lynelle Doctor for lung spot   Aimee Lewis had pneumonia recently and had a CT scan to evaluate further.   > she was diagnosed with pneumonia around January > she ahda severe cough with lots of phlegm > she had pneumonia many years ago when her son was 6 weeks OK, this was 34 years > she has had bronchitis in the past > she now feels well > she is no longer producing > her weight has been up and down > no chest pain  She tells me that her pneumonia symptoms actually lasted for about 6 weeks.  She took over 30 days of antibiotics before the symptoms improved.  She smoked 1ppd and still smokes.  Started smoking in college.  Her mother had emphysema and her father had lung cancer.  She works in a [employer] as an Web designer.   Past Medical History:  Diagnosis Date  . Diabetes mellitus without complication ([place])      History reviewed. No pertinent family history.   Social History   Socioeconomic History  . Marital status: Married    Spouse name: Not on file  . Number of children: Not on file  . Years of education: Not on file  . Highest education level: Not on file  Occupational History  . Not on file  Social Needs  . Financial resource strain: Not on file  . Food insecurity:    Worry: Not on file    Inability: Not on file  . Transportation needs:    Medical: Not on file    Non-medical: Not on file  Tobacco Use  . Smoking status: Current Every Day Smoker    Packs/day: 1.00    Types: Cigarettes  . Smokeless tobacco: Former Network engineer and Sexual Activity  . Alcohol use: Yes    Comment: socially  . Drug use: No  . Sexual activity: Not on file  Lifestyle  . Physical activity:    Days per week:  Not on file    Minutes per session: Not on file  . Stress: Not on file  Relationships  . Social connections:    Talks on phone: Not on file    Gets together: Not on file    Attends religious service: Not on file    Active member of club or organization: Not on file    Attends meetings of clubs or organizations: Not on file    Relationship status: Not on file  . Intimate partner violence:    Fear of current or ex partner: Not on file    Emotionally abused: Not on file    Physically abused: Not on file    Forced sexual activity: Not on file  Other Topics Concern  . Not on file  Social History Narrative  . Not on file     Allergies  Allergen Reactions  . Codeine Nausea And Vomiting  . Darvon [Propoxyphene] Nausea And Vomiting     Outpatient Medications Prior to Visit  Medication Sig Dispense Refill  . lisinopril (PRINIVIL,ZESTRIL) 40 MG tablet Take 40 mg by mouth daily.    Marland Kitchen HYDROcodone-acetaminophen (NORCO/VICODIN) 5-325 MG per tablet Take 1-2 tablets by mouth every 6 (six)  hours as needed for moderate pain. 30 tablet 0  . ondansetron (ZOFRAN ODT) 8 MG disintegrating tablet Take 1 tablet (8 mg total) by mouth every 8 (eight) hours as needed for nausea or vomiting. 10 tablet 0   No facility-administered medications prior to visit.       Review of Systems  Constitutional: Negative for fever and unexpected weight change.  HENT: Negative for congestion, dental problem, ear pain, nosebleeds, postnasal drip, rhinorrhea, sinus pressure, sneezing, sore throat and trouble swallowing.   Eyes: Negative for redness and itching.  Respiratory: Negative for cough, chest tightness, shortness of breath and wheezing.   Cardiovascular: Negative for palpitations and leg swelling.  Gastrointestinal: Negative for nausea and vomiting.  Genitourinary: Negative for dysuria.  Musculoskeletal: Negative for joint swelling.  Skin: Negative for rash.  Allergic/Immunologic: Negative.  Negative for  environmental allergies, food allergies and immunocompromised state.  Neurological: Negative for headaches.  Hematological: Does not bruise/bleed easily.  Psychiatric/Behavioral: Negative for dysphoric mood. The patient is not nervous/anxious.        Objective:   Physical Exam  Vitals:   01/02/18 1426  BP: 126/82  Pulse: 99  SpO2: (!) 87%  RA  Gen: well appearing, no acute distress HENT: NCAT, OP clear, neck supple without masses Eyes: PERRL, EOMi Lymph: no cervical lymphadenopathy PULM: CTA B CV: RRR, no mgr, no JVD GI: BS+, soft, nontender, no hsm Derm: no rash or skin breakdown MSK: normal bulk and tone Neuro: A&Ox4, CN II-XII intact, strength 5/5 in all 4 extremities Psyche: normal mood and affect   February 2019 CT chest images independently reviewed showing an oblong 4 cm x 1.8 cm glass nodule in the left upper lobe which appears to be subsegmental March 2019 PET/CT shows essentially no change in the size of the lesion described above, low-level PET positive       Assessment & Plan:   No diagnosis found.  Discussion: This is a pleasant 57 year old smoking female who has a persistent lesion seen on the left upper lobe CT scan and PET scanning several weeks after resolution of community-acquired pneumonia.  Given the duration of time after pneumonia treatment and the lack of pneumonia symptoms think that it is best that we sample this to rule out malignancy.  Given her smoking history she is at increased risk for malignancy.  The differential diagnosis of this lesion is resolving pneumonia or scarring from a severe case of pneumonia, but I think her risk category is too high to sit and watch this with serial imaging.  We discussed this at length today.  We talked about different options including resection of the lesion, navigational bronchoscopy of the lesion, and serial imaging.  After discussion she has elected to undergo a navigational bronchoscopy for a  diagnosis.  Plan: Tobacco abuse: You should stop smoking right away Please review the smoking cessation sheet we gave you in clinic today  Left upper lobe lesion: As we discussed today, I am not sure what this is.  It could be resolving pneumonia but given your smoking history we need to rule out malignancy. We will make arrangements for something called a navigational bronchoscopy with Dr. Baltazar Apo.  Our office will make arrangements for this.  We will see you back in 2-4 weeks or sooner if needed   Current Outpatient Medications:  .  lisinopril (PRINIVIL,ZESTRIL) 40 MG tablet, Take 40 mg by mouth daily., Disp: , Rfl:

## 2018-01-02 NOTE — Progress Notes (Deleted)
Synopsis: Referred in *** for ***  Subjective:   PATIENT ID: Bonnita Hollow GENDER: female DOB: September 13, 1961, MRN: [id number]   HPI  No chief complaint on file.   ***  Past Medical History:  Diagnosis Date  . Diabetes mellitus without complication      No family history on file.   Social History   Socioeconomic History  . Marital status: Married    Spouse name: Not on file  . Number of children: Not on file  . Years of education: Not on file  . Highest education level: Not on file  Occupational History  . Not on file  Social Needs  . Financial resource strain: Not on file  . Food insecurity:    Worry: Not on file    Inability: Not on file  . Transportation needs:    Medical: Not on file    Non-medical: Not on file  Tobacco Use  . Smoking status: Current Every Day Smoker    Packs/day: 1.00    Types: Cigarettes  Substance and Sexual Activity  . Alcohol use: Yes    Comment: socially  . Drug use: No  . Sexual activity: Not on file  Lifestyle  . Physical activity:    Days per week: Not on file    Minutes per session: Not on file  . Stress: Not on file  Relationships  . Social connections:    Talks on phone: Not on file    Gets together: Not on file    Attends religious service: Not on file    Active member of club or organization: Not on file    Attends meetings of clubs or organizations: Not on file    Relationship status: Not on file  . Intimate partner violence:    Fear of current or ex partner: Not on file    Emotionally abused: Not on file    Physically abused: Not on file    Forced sexual activity: Not on file  Other Topics Concern  . Not on file  Social History Narrative  . Not on file     Allergies  Allergen Reactions  . Codeine Nausea And Vomiting  . Darvon [Propoxyphene] Nausea And Vomiting     Outpatient Medications Prior to Visit  Medication Sig Dispense Refill  . HYDROcodone-acetaminophen (NORCO/VICODIN) 5-325 MG per tablet  Take 1-2 tablets by mouth every 6 (six) hours as needed for moderate pain. 30 tablet 0  . lisinopril (PRINIVIL,ZESTRIL) 40 MG tablet Take 40 mg by mouth daily.    . ondansetron (ZOFRAN ODT) 8 MG disintegrating tablet Take 1 tablet (8 mg total) by mouth every 8 (eight) hours as needed for nausea or vomiting. 10 tablet 0   No facility-administered medications prior to visit.     ROS    Objective:  Physical Exam   There were no vitals filed for this visit.  ***  CBC No results found for: WBC, RBC, HGB, HCT, PLT, MCV, MCH, MCHC, RDW, LYMPHSABS, MONOABS, EOSABS, BASOSABS   Chest imaging:  PFT:  Labs:  Path:  Echo:  Heart Catheterization:       Assessment & Plan:   No diagnosis found.  Discussion: ***    Current Outpatient Medications:  .  HYDROcodone-acetaminophen (NORCO/VICODIN) 5-325 MG per tablet, Take 1-2 tablets by mouth every 6 (six) hours as needed for moderate pain., Disp: 30 tablet, Rfl: 0 .  lisinopril (PRINIVIL,ZESTRIL) 40 MG tablet, Take 40 mg by mouth daily., Disp: , Rfl:  .  ondansetron (ZOFRAN ODT) 8 MG disintegrating tablet, Take 1 tablet (8 mg total) by mouth every 8 (eight) hours as needed for nausea or vomiting., Disp: 10 tablet, Rfl: 0

## 2018-01-04 ENCOUNTER — Inpatient Hospital Stay: Admission: RE | Admit: 2018-01-04 | Payer: BLUE CROSS/BLUE SHIELD | Source: Ambulatory Visit

## 2018-01-05 ENCOUNTER — Ambulatory Visit (INDEPENDENT_AMBULATORY_CARE_PROVIDER_SITE_OTHER)
Admission: RE | Admit: 2018-01-05 | Discharge: 2018-01-05 | Disposition: A | Discharge status: Home / Self Care | Payer: BLUE CROSS/BLUE SHIELD | Source: Ambulatory Visit | Attending: Pulmonary Disease | Admitting: Pulmonary Disease

## 2018-01-05 DIAGNOSIS — R918 Other nonspecific abnormal finding of lung field: Secondary | ICD-10-CM | POA: Diagnosis not present

## 2018-01-10 ENCOUNTER — Inpatient Hospital Stay (HOSPITAL_COMMUNITY): Admission: RE | Admit: 2018-01-10 | Payer: BLUE CROSS/BLUE SHIELD | Source: Ambulatory Visit

## 2018-01-10 ENCOUNTER — Telehealth: Payer: Self-pay | Admitting: Pulmonary Disease

## 2018-01-11 NOTE — Telephone Encounter (Signed)
Pt has been rescheduled to 01/16/18@8am  for preop instructions and she is aware Aimee Lewis

## 2018-01-12 ENCOUNTER — Telehealth: Payer: Self-pay | Admitting: Emergency Medicine

## 2018-01-12 NOTE — Telephone Encounter (Signed)
Dr. Lamonte Sakai patient needs orders placed for procedure on 4/15. Thanks!

## 2018-01-13 NOTE — Pre-Procedure Instructions (Signed)
Aimee Lewis  01/13/2018      [facility] [address], [place] [address] Phone: [phone] Fax: [phone]    Your procedure is scheduled on January 18, 2018.  Report to [facility] Admitting at 630 AM.  Call this number if you have problems the morning of surgery:  [phone]   Remember:  Do not eat food or drink liquids after midnight.  Take these medicines the morning of surgery with A SIP OF WATER flonase nasal spray-if needed  Contacts, dentures or bridgework may not be worn into surgery.  Leave your suitcase in the car.  After surgery it may be brought to your room.  For patients admitted to the hospital, discharge time will be determined by your treatment team.  Patients discharged the day of surgery will not be allowed to drive home.    Aimee Lewis- Preparing For Surgery  Before surgery, you can play an important role. Because skin is not sterile, your skin needs to be as free of germs as possible. You can reduce the number of germs on your skin by washing with CHG (chlorahexidine gluconate) Soap before surgery.  CHG is an antiseptic cleaner which kills germs and bonds with the skin to continue killing germs even after washing.  Please do not use if you have an allergy to CHG or antibacterial soaps. If your skin becomes reddened/irritated stop using the CHG.  Do not shave (including legs and underarms) for at least 48 hours prior to first CHG shower. It is OK to shave your face.  Please follow these instructions carefully.   1. Shower the NIGHT BEFORE SURGERY and the MORNING OF SURGERY with CHG.   2. If you chose to wash your hair, wash your hair first as usual with your normal shampoo.  3. After you shampoo, rinse your hair and body thoroughly to remove the shampoo.  4. Use CHG as you would any other liquid soap. You can apply CHG directly to the skin and wash gently with a  scrungie or a clean washcloth.   5. Apply the CHG Soap to your body ONLY FROM THE NECK DOWN.  Do not use on open wounds or open sores. Avoid contact with your eyes, ears, mouth and genitals (private parts). Wash Face and genitals (private parts)  with your normal soap.  6. Wash thoroughly, paying special attention to the area where your surgery will be performed.  7. Thoroughly rinse your body with warm water from the neck down.  8. DO NOT shower/wash with your normal soap after using and rinsing off the CHG Soap.  9. Pat yourself dry with a CLEAN TOWEL.  10. Wear CLEAN PAJAMAS to bed the night before surgery, wear comfortable clothes the morning of surgery  11. Place CLEAN SHEETS on your bed the night of your first shower and DO NOT SLEEP WITH PETS.  Day of Surgery: Do not apply any deodorants/lotions. Please wear clean clothes to the hospital/surgery center.     Do not wear jewelry, make-up or nail polish.  Do not wear lotions, powders, or perfumes, or deodorant.  Do not shave 48 hours prior to surgery.  Men may shave face and neck.  Do not bring valuables to the hospital.  [facility] is not responsible for any belongings or valuables.  Please read over the following fact sheets that you were given. Pain Booklet and Coughing and Deep Breathing

## 2018-01-16 ENCOUNTER — Encounter (HOSPITAL_COMMUNITY)
Admission: RE | Admit: 2018-01-16 | Discharge: 2018-01-16 | Disposition: A | Discharge status: Home / Self Care | Payer: BLUE CROSS/BLUE SHIELD | Source: Ambulatory Visit | Attending: Emergency Medicine | Admitting: Emergency Medicine

## 2018-01-16 ENCOUNTER — Encounter (HOSPITAL_COMMUNITY): Payer: Self-pay

## 2018-01-16 ENCOUNTER — Other Ambulatory Visit: Payer: Self-pay

## 2018-01-16 DIAGNOSIS — F17210 Nicotine dependence, cigarettes, uncomplicated: Secondary | ICD-10-CM | POA: Diagnosis not present

## 2018-01-16 DIAGNOSIS — I10 Essential (primary) hypertension: Secondary | ICD-10-CM | POA: Diagnosis not present

## 2018-01-16 DIAGNOSIS — Z79899 Other long term (current) drug therapy: Secondary | ICD-10-CM | POA: Diagnosis not present

## 2018-01-16 DIAGNOSIS — E119 Type 2 diabetes mellitus without complications: Secondary | ICD-10-CM | POA: Diagnosis not present

## 2018-01-16 DIAGNOSIS — R918 Other nonspecific abnormal finding of lung field: Secondary | ICD-10-CM | POA: Diagnosis not present

## 2018-01-16 DIAGNOSIS — Z885 Allergy status to narcotic agent status: Secondary | ICD-10-CM | POA: Diagnosis not present

## 2018-01-16 DIAGNOSIS — J449 Chronic obstructive pulmonary disease, unspecified: Secondary | ICD-10-CM | POA: Diagnosis not present

## 2018-01-16 HISTORY — DX: Essential (primary) hypertension: I10

## 2018-01-16 HISTORY — DX: Chronic obstructive pulmonary disease, unspecified: J44.9

## 2018-01-16 HISTORY — DX: Pneumonia, unspecified organism: J18.9

## 2018-01-16 LAB — CBC
HCT: 39.7 % (ref 36.0–46.0)
Hemoglobin: 13 g/dL (ref 12.0–15.0)
MCH: 32.7 pg (ref 26.0–34.0)
MCHC: 32.7 g/dL (ref 30.0–36.0)
MCV: 99.7 fL (ref 78.0–100.0)
Platelets: 299 10*3/uL (ref 150–400)
RBC: 3.98 MIL/uL (ref 3.87–5.11)
RDW: 13.5 % (ref 11.5–15.5)
WBC: 4.2 10*3/uL (ref 4.0–10.5)

## 2018-01-16 LAB — BASIC METABOLIC PANEL
Anion gap: 13 (ref 5–15)
BUN: 13 mg/dL (ref 6–20)
CO2: 22 mmol/L (ref 22–32)
Calcium: 9.8 mg/dL (ref 8.9–10.3)
Chloride: 102 mmol/L (ref 101–111)
Creatinine, Ser: 0.75 mg/dL (ref 0.44–1.00)
GFR calc Af Amer: >60 mL/min (ref >60)
GFR calc non Af Amer: >60 mL/min (ref >60)
Glucose, Bld: 102 mg/dL — ABNORMAL HIGH (ref 65–99)
Potassium: 4.4 mmol/L (ref 3.5–5.1)
Sodium: 137 mmol/L (ref 135–145)

## 2018-01-16 NOTE — Progress Notes (Signed)
PCP - Dr. Lake Bells Cardiologist - patient denies  Chest x-ray - n/a EKG - 01/16/2018 Stress Test - patient denies ECHO - patient denies Cardiac Cath - patient denies  Sleep Study - patient denies   Anesthesia review: n/a  Patient denies shortness of breath, fever, cough and chest pain at PAT appointment   Patient verbalized understanding of instructions that were given to them at the PAT appointment. Patient was also instructed that they will need to review over the PAT instructions again at home before surgery.

## 2018-01-18 ENCOUNTER — Ambulatory Visit (HOSPITAL_COMMUNITY): Payer: BLUE CROSS/BLUE SHIELD

## 2018-01-18 ENCOUNTER — Ambulatory Visit (HOSPITAL_COMMUNITY)
Admission: RE | Admit: 2018-01-18 | Payer: BLUE CROSS/BLUE SHIELD | Source: Ambulatory Visit | Admitting: Emergency Medicine

## 2018-01-18 ENCOUNTER — Ambulatory Visit (HOSPITAL_COMMUNITY)
Admission: RE | Admit: 2018-01-18 | Discharge: 2018-01-18 | Disposition: A | Discharge status: Home / Self Care | Payer: BLUE CROSS/BLUE SHIELD | Source: Ambulatory Visit | Attending: Emergency Medicine | Admitting: Emergency Medicine

## 2018-01-18 ENCOUNTER — Encounter (HOSPITAL_COMMUNITY): Admission: RE | Payer: Self-pay | Source: Ambulatory Visit

## 2018-01-18 ENCOUNTER — Ambulatory Visit (HOSPITAL_COMMUNITY): Payer: BLUE CROSS/BLUE SHIELD | Admitting: Emergency Medicine

## 2018-01-18 ENCOUNTER — Encounter (HOSPITAL_COMMUNITY)
Admission: RE | Disposition: A | Discharge status: Home / Self Care | Payer: Self-pay | Source: Ambulatory Visit | Attending: Emergency Medicine

## 2018-01-18 ENCOUNTER — Ambulatory Visit (HOSPITAL_COMMUNITY): Payer: BLUE CROSS/BLUE SHIELD | Admitting: Certified Registered Nurse Anesthetist

## 2018-01-18 ENCOUNTER — Other Ambulatory Visit: Payer: Self-pay

## 2018-01-18 ENCOUNTER — Encounter (HOSPITAL_COMMUNITY): Payer: Self-pay | Admitting: *Deleted

## 2018-01-18 DIAGNOSIS — Z79899 Other long term (current) drug therapy: Secondary | ICD-10-CM | POA: Diagnosis not present

## 2018-01-18 DIAGNOSIS — Z885 Allergy status to narcotic agent status: Secondary | ICD-10-CM | POA: Diagnosis not present

## 2018-01-18 DIAGNOSIS — R918 Other nonspecific abnormal finding of lung field: Secondary | ICD-10-CM | POA: Diagnosis not present

## 2018-01-18 DIAGNOSIS — R9389 Abnormal findings on diagnostic imaging of other specified body structures: Secondary | ICD-10-CM | POA: Diagnosis not present

## 2018-01-18 DIAGNOSIS — R848 Other abnormal findings in specimens from respiratory organs and thorax: Secondary | ICD-10-CM | POA: Diagnosis not present

## 2018-01-18 DIAGNOSIS — C3412 Malignant neoplasm of upper lobe, left bronchus or lung: Secondary | ICD-10-CM | POA: Diagnosis not present

## 2018-01-18 DIAGNOSIS — E119 Type 2 diabetes mellitus without complications: Secondary | ICD-10-CM | POA: Diagnosis not present

## 2018-01-18 DIAGNOSIS — I10 Essential (primary) hypertension: Secondary | ICD-10-CM | POA: Diagnosis not present

## 2018-01-18 DIAGNOSIS — Z9889 Other specified postprocedural states: Secondary | ICD-10-CM

## 2018-01-18 DIAGNOSIS — F17210 Nicotine dependence, cigarettes, uncomplicated: Secondary | ICD-10-CM | POA: Insufficient documentation

## 2018-01-18 DIAGNOSIS — J449 Chronic obstructive pulmonary disease, unspecified: Secondary | ICD-10-CM | POA: Insufficient documentation

## 2018-01-18 DIAGNOSIS — Z419 Encounter for procedure for purposes other than remedying health state, unspecified: Secondary | ICD-10-CM

## 2018-01-18 HISTORY — PX: VIDEO BRONCHOSCOPY WITH ENDOBRONCHIAL NAVIGATION: SHX6175

## 2018-01-18 LAB — BODY FLUID CELL COUNT WITH DIFFERENTIAL
Eos, Fluid: 4 %
Lymphs, Fluid: 11 %
Monocyte-Macrophage-Serous Fluid: 20 % — ABNORMAL LOW (ref 50–90)
Neutrophil Count, Fluid: 65 % — ABNORMAL HIGH (ref 0–25)
Total Nucleated Cell Count, Fluid: 27 cu mm (ref 0–1000)

## 2018-01-18 SURGERY — VIDEO BRONCHOSCOPY WITH ENDOBRONCHIAL NAVIGATION
Anesthesia: General | Laterality: Left

## 2018-01-18 SURGERY — VIDEO BRONCHOSCOPY WITH ENDOBRONCHIAL NAVIGATION
Anesthesia: General

## 2018-01-18 MED ORDER — ONDANSETRON HCL 4 MG/2ML IJ SOLN
INTRAMUSCULAR | Status: DC | PRN
  Administered 2018-01-18: 4 mg via INTRAVENOUS

## 2018-01-18 MED ORDER — DEXAMETHASONE SODIUM PHOSPHATE 10 MG/ML IJ SOLN
INTRAMUSCULAR | Status: DC | PRN
  Administered 2018-01-18: 10 mg via INTRAVENOUS

## 2018-01-18 MED ORDER — PROPOFOL 10 MG/ML IV BOLUS
INTRAVENOUS | Status: DC | PRN
  Administered 2018-01-18: 100 mg via INTRAVENOUS

## 2018-01-18 MED ORDER — DEXTROSE 5 % IV SOLN
INTRAVENOUS | Status: DC | PRN
  Administered 2018-01-18: 25 ug/min via INTRAVENOUS

## 2018-01-18 MED ORDER — LACTATED RINGERS IV SOLN
INTRAVENOUS | Status: DC | PRN
  Administered 2018-01-18: 08:00:00 via INTRAVENOUS

## 2018-01-18 MED ORDER — LIDOCAINE HCL (CARDIAC) 20 MG/ML IV SOLN
INTRAVENOUS | Status: DC | PRN
  Administered 2018-01-18: 40 mg via INTRAVENOUS

## 2018-01-18 MED ORDER — SUGAMMADEX SODIUM 200 MG/2ML IV SOLN
INTRAVENOUS | Status: DC | PRN
  Administered 2018-01-18: 200 mg via INTRAVENOUS

## 2018-01-18 MED ORDER — ROCURONIUM BROMIDE 100 MG/10ML IV SOLN
INTRAVENOUS | Status: DC | PRN
  Administered 2018-01-18: 20 mg via INTRAVENOUS
  Administered 2018-01-18: 50 mg via INTRAVENOUS

## 2018-01-18 MED ORDER — FENTANYL CITRATE (PF) 100 MCG/2ML IJ SOLN
INTRAMUSCULAR | Status: DC | PRN
  Administered 2018-01-18: 100 ug via INTRAVENOUS
  Administered 2018-01-18: 50 ug via INTRAVENOUS

## 2018-01-18 MED ORDER — 0.9 % SODIUM CHLORIDE (POUR BTL) OPTIME
TOPICAL | Status: DC | PRN
  Administered 2018-01-18: 1000 mL

## 2018-01-18 MED ORDER — PHENYLEPHRINE HCL 10 MG/ML IJ SOLN
INTRAMUSCULAR | Status: DC | PRN
  Administered 2018-01-18: 80 ug via INTRAVENOUS
  Administered 2018-01-18: 120 ug via INTRAVENOUS

## 2018-01-18 MED ORDER — LIDOCAINE HCL 4 % EX SOLN
CUTANEOUS | Status: DC | PRN
  Administered 2018-01-18: 2 mL via TOPICAL

## 2018-01-18 MED ORDER — MIDAZOLAM HCL 5 MG/5ML IJ SOLN
INTRAMUSCULAR | Status: DC | PRN
  Administered 2018-01-18: 2 mg via INTRAVENOUS

## 2018-01-18 MED ORDER — FENTANYL CITRATE (PF) 100 MCG/2ML IJ SOLN: 25.0000 ug | INTRAMUSCULAR | Status: DC | PRN

## 2018-01-18 SURGICAL SUPPLY — 38 items
ADAPTER BRONCH F/PENTAX (ADAPTER) ×2 IMPLANT
BRUSH CYTOL CELLEBRITY 1.5X140 (MISCELLANEOUS) ×2 IMPLANT
BRUSH SUPERTRAX BIOPSY (INSTRUMENTS) IMPLANT
BRUSH SUPERTRAX NDL-TIP CYTO (INSTRUMENTS) ×2 IMPLANT
CANISTER SUCT 3000ML PPV (MISCELLANEOUS) ×2 IMPLANT
CHANNEL WORK EXTEND EDGE 180 (KITS) IMPLANT
CHANNEL WORK EXTEND EDGE 45 (KITS) IMPLANT
CHANNEL WORK EXTEND EDGE 90 (KITS) IMPLANT
CONT SPEC 4OZ CLIKSEAL STRL BL (MISCELLANEOUS) ×2 IMPLANT
COVER BACK TABLE 60X90IN (DRAPES) ×2 IMPLANT
FORCEPS BIOP SUPERTRX PREMAR (INSTRUMENTS) ×2 IMPLANT
FORCEPS RADIAL JAW LRG 4 PULM (INSTRUMENTS) ×1 IMPLANT
GAUZE SPONGE 4X4 12PLY STRL (GAUZE/BANDAGES/DRESSINGS) ×2 IMPLANT
GLOVE BIO SURGEON STRL SZ7.5 (GLOVE) ×2 IMPLANT
GLOVE BIOGEL PI IND STRL 6.5 (GLOVE) ×1 IMPLANT
GLOVE BIOGEL PI INDICATOR 6.5 (GLOVE) ×1
GOWN STRL REUS W/ TWL LRG LVL3 (GOWN DISPOSABLE) ×2 IMPLANT
GOWN STRL REUS W/TWL LRG LVL3 (GOWN DISPOSABLE) ×2
KIT CLEAN ENDO COMPLIANCE (KITS) ×2 IMPLANT
KIT LOCATABLE GUIDE (CANNULA) IMPLANT
KIT MARKER FIDUCIAL DELIVERY (KITS) IMPLANT
KIT PROCEDURE EDGE 180 (KITS) ×2 IMPLANT
KIT TURNOVER KIT B (KITS) ×2 IMPLANT
MARKER SKIN DUAL TIP RULER LAB (MISCELLANEOUS) ×2 IMPLANT
NEEDLE SUPERTRX PREMARK BIOPSY (NEEDLE) ×2 IMPLANT
NS IRRIG 1000ML POUR BTL (IV SOLUTION) ×2 IMPLANT
OIL SILICONE PENTAX (PARTS (SERVICE/REPAIRS)) ×2 IMPLANT
PAD ARMBOARD 7.5X6 YLW CONV (MISCELLANEOUS) ×4 IMPLANT
PATCHES PATIENT (LABEL) ×6 IMPLANT
RADIAL JAW LRG 4 PULMONARY (INSTRUMENTS) ×1
SYR 20CC LL (SYRINGE) ×2 IMPLANT
SYR 20ML ECCENTRIC (SYRINGE) ×2 IMPLANT
SYR 50ML SLIP (SYRINGE) ×2 IMPLANT
TOWEL OR 17X24 6PK STRL BLUE (TOWEL DISPOSABLE) ×2 IMPLANT
TUBE CONNECTING 20X1/4 (TUBING) ×2 IMPLANT
UNDERPAD 30X30 (UNDERPADS AND DIAPERS) ×2 IMPLANT
VALVE DISPOSABLE (MISCELLANEOUS) ×2 IMPLANT
WATER STERILE IRR 1000ML POUR (IV SOLUTION) ×2 IMPLANT

## 2018-01-18 NOTE — Discharge Instructions (Signed)
Flexible Bronchoscopy, Care After These instructions give you information on caring for yourself after your procedure. Your doctor may also give you more specific instructions. Call your doctor if you have any problems or questions after your procedure. Follow these instructions at home:  Do not eat or drink anything for 2 hours after your procedure. If you try to eat or drink before the medicine wears off, food or drink could go into your lungs. You could also burn yourself.  After 2 hours have passed and when you can cough and gag normally, you may eat soft food and drink liquids slowly.  The day after the test, you may eat your normal diet.  You may do your normal activities.  Keep all doctor visits. Get help right away if:  You get more and more short of breath.  You get light-headed.  You feel like you are going to pass out (faint).  You have chest pain.  You have new problems that worry you.  You cough up more than a little blood.  You cough up more blood than before.   Please contact our office for any questions or concerns.  [phone].   This information is not intended to replace advice given to you by your health care provider. Make sure you discuss any questions you have with your health care provider. Document Released: 07/18/2009 Document Revised: 02/26/2016 Document Reviewed: 05/25/2013 Elsevier Interactive Patient Education  2017 [employer].

## 2018-01-18 NOTE — Anesthesia Procedure Notes (Signed)
Procedure Name: Intubation Date/Time: 01/18/2018 8:42 AM Performed by: Inda Coke, CRNA Pre-anesthesia Checklist: Patient identified, Emergency Drugs available, Suction available and Patient being monitored Patient Re-evaluated:Patient Re-evaluated prior to induction Oxygen Delivery Method: Circle System Utilized Preoxygenation: Pre-oxygenation with 100% oxygen Induction Type: IV induction Ventilation: Mask ventilation without difficulty Laryngoscope Size: Mac and 3 Grade View: Grade I Tube type: Oral Tube size: 8.5 mm Number of attempts: 1 Airway Equipment and Method: Stylet and Oral airway Placement Confirmation: ETT inserted through vocal cords under direct vision,  positive ETCO2 and breath sounds checked- equal and bilateral Secured at: 22 cm Tube secured with: Tape Dental Injury: Teeth and Oropharynx as per pre-operative assessment

## 2018-01-18 NOTE — Progress Notes (Signed)
Arrived to pacu with iv infusing from or

## 2018-01-18 NOTE — Interval H&P Note (Signed)
PCCM Interval Hx  57 yo smoker with a persistent oblong somewhat poorly formed LUL infiltrate, unclear etiology. She presents today for further eval. She continues to smoke. No new issues reported. She does have baseline cough, scant sputum.   Vitals:   01/18/18 0702 01/18/18 0721  BP: 127/70   Pulse: 82   Resp: 20   Temp: 98.3 F (36.8 C)   TempSrc: Oral   SpO2: 98%   Weight: 56.7 kg (125 lb) 56.7 kg (125 lb)  Height:  5' 2.5" (1.588 m)   Gen: Pleasant, well-nourished, in no distress,  normal affect  ENT: No lesions,  mouth clear,  oropharynx clear, no postnasal drip, M 2 airway  Neck: No JVD, no stridor  Lungs: No use of accessory muscles, distant, no wheezes or crackles.   Cardiovascular: RRR, heart sounds normal, no murmur or gallops, no peripheral edema  Musculoskeletal: No deformities, no cyanosis or clubbing  Neuro: alert, non focal  Skin: Warm, no lesions or rashes   Impression / Plan Persistent LUL opacity in a smoker, ? Etiology butv some concern for primary lung malignancy  Will proceed with navigational FOB to the area for bx's, brushings, BAL.  All questions answered. No barriers   Baltazar Apo, MD, PhD 01/18/2018, 8:32 AM [facility] [phone] or if no answer [phone]

## 2018-01-18 NOTE — Anesthesia Preprocedure Evaluation (Signed)
Anesthesia Evaluation  Patient identified by MRN, date of birth, ID band Patient awake    Reviewed: Allergy & Precautions, NPO status , Patient's Chart, lab work & pertinent test results  History of Anesthesia Complications Negative for: history of anesthetic complications  Airway Mallampati: I  TM Distance: >3 FB Neck ROM: Full    Dental  (+) Teeth Intact,    Pulmonary shortness of breath and with exertion, COPD,  COPD inhaler, Current Smoker,     + decreased breath sounds      Cardiovascular hypertension, Pt. on medications  Rhythm:Regular     Neuro/Psych negative neurological ROS  negative psych ROS   GI/Hepatic negative GI ROS, Neg liver ROS,   Endo/Other  negative endocrine ROS  Renal/GU negative Renal ROS     Musculoskeletal negative musculoskeletal ROS (+)   Abdominal   Peds  Hematology negative hematology ROS (+)   Anesthesia Other Findings   Reproductive/Obstetrics                             Anesthesia Physical Anesthesia Plan  ASA: II  Anesthesia Plan: General   Post-op Pain Management:    Induction: Intravenous  PONV Risk Score and Plan: 2 and Ondansetron and Dexamethasone  Airway Management Planned: Oral ETT  Additional Equipment: None  Intra-op Plan:   Post-operative Plan: Extubation in OR  Informed Consent: I have reviewed the patients History and Physical, chart, labs and discussed the procedure including the risks, benefits and alternatives for the proposed anesthesia with the patient or authorized representative who has indicated his/her understanding and acceptance.   Dental advisory given  Plan Discussed with: CRNA and Surgeon  Anesthesia Plan Comments:         Anesthesia Quick Evaluation

## 2018-01-18 NOTE — Transfer of Care (Signed)
Immediate Anesthesia Transfer of Care Note  Patient: Aimee Lewis  Procedure(s) Performed: VIDEO BRONCHOSCOPY WITH ENDOBRONCHIAL NAVIGATION WITH BIOPSIES (N/A )  Patient Location: PACU  Anesthesia Type:General  Level of Consciousness: awake, alert  and oriented  Airway & Oxygen Therapy: Patient Spontanous Breathing and Patient connected to nasal cannula oxygen  Post-op Assessment: Report given to RN, Post -op Vital signs reviewed and stable and Patient moving all extremities X 4  Post vital signs: Reviewed and stable  Last Vitals:  Vitals Value Taken Time  BP    Temp    Pulse    Resp    SpO2      Last Pain:  Vitals:   01/18/18 0721  TempSrc:   PainSc: 0-No pain         Complications: No apparent anesthesia complications

## 2018-01-18 NOTE — Op Note (Signed)
Video Bronchoscopy with Electromagnetic Navigation Procedure Note  Date of Operation: 01/18/2018  Pre-op Diagnosis: Non-resolving LUL infiltrate  Post-op Diagnosis: same  Surgeon: Baltazar Apo  Assistants: none  Anesthesia: General endotracheal anesthesia  Operation: Flexible video fiberoptic bronchoscopy with electromagnetic navigation and biopsies.  Estimated Blood Loss: Minimal  Complications: None apparent  Indications and History: Aimee Lewis is a 57 y.o. female with hx tobacco, noted to have LUL infiltrate. Non-resolving after treatment for suspected Pneumonia. Recommendation was made to evaluate further with navigational bronchoscopy.  The risks, benefits, complications, treatment options and expected outcomes were discussed with the patient.  The possibilities of pneumothorax, pneumonia, reaction to medication, pulmonary aspiration, perforation of a viscus, bleeding, failure to diagnose a condition and creating a complication requiring transfusion or operation were discussed with the patient who freely signed the consent.    Description of Procedure: The patient was seen in the Preoperative Area, was examined and was deemed appropriate to proceed.  The patient was taken to OR 10, identified as Aimee Lewis and the procedure verified as Flexible Video Fiberoptic Bronchoscopy.  A Time Out was held and the above information confirmed.   Prior to the date of the procedure a high-resolution CT scan of the chest was performed. Utilizing [place] a virtual tracheobronchial tree was generated to allow the creation of distinct navigation pathways to the patient's LUL  parenchymal abnormality. After being taken to the operating room general anesthesia was initiated and the patient  was orally intubated. The video fiberoptic bronchoscope was introduced via the endotracheal tube and a general inspection was performed which showed normal airways on the right.  There  was a smooth area of raised bronchial mucosa at the orifice of the left upper lobe bronchus.  Somewhat vascular but no evidence of friability.  Endobronchial brushings and endobronchial biopsies were performed at this location to be sent for cytology and pathology. The extendable working channel and locator guide then were introduced into the bronchoscope. The distinct navigation pathway prepared prior to this procedure was then utilized to navigate to within 0.5cm of patient's left upper lobe infiltrate identified on CT scan.  Local fluoroscopic navigation was then performed to fine tune positioning.  The extendable working channel was secured into place and the locator guide was withdrawn. Under fluoroscopic guidance transbronchial needle brushings, transbronchial Wang needle biopsies, and transbronchial forceps biopsies were performed to be sent for cytology and pathology. A bronchioalveolar lavage was performed in the left upper lobe infiltrate and sent for cytology, cell count and microbiology (bacterial, fungal, AFB smears and cultures). At the end of the procedure a general airway inspection was performed and there was no evidence of active bleeding. The bronchoscope was removed.  The patient tolerated the procedure well. There was no significant blood loss and there were no obvious complications. A post-procedural chest x-ray is pending.  Samples: 1. Transbronchial needle brushings from left upper lobe infiltrate 2. Transbronchial Wang needle biopsies from left upper lobe infiltrate 3. Transbronchial forceps biopsies from left upper lobe infiltrate 4. Bronchoalveolar lavage from left upper lobe 5. Endobronchial biopsies from orifice of the left upper lobe bronchus 6.  Endobronchial brushings from orifice of the left upper lobe bronchus  Plans:  The patient will be discharged from the PACU to home when recovered from anesthesia and after chest x-ray is reviewed. We will review the cytology,  pathology and microbiology results with the patient when they become available. Outpatient followup will be with Dr Lake Bells.   Aimee Lewis  Aimee Sakai, MD, PhD 01/18/2018, 10:14 AM [facility] [phone] or if no answer [phone]

## 2018-01-19 ENCOUNTER — Encounter (HOSPITAL_COMMUNITY): Payer: Self-pay | Admitting: Emergency Medicine

## 2018-01-19 LAB — ACID FAST SMEAR (AFB, MYCOBACTERIA): Acid Fast Smear: NEGATIVE

## 2018-01-20 ENCOUNTER — Telehealth: Payer: Self-pay | Admitting: Emergency Medicine

## 2018-01-20 DIAGNOSIS — C3490 Malignant neoplasm of unspecified part of unspecified bronchus or lung: Secondary | ICD-10-CM

## 2018-01-20 LAB — CULTURE, RESPIRATORY W GRAM STAIN
Culture: NO GROWTH
Gram Stain: NONE SEEN

## 2018-01-20 NOTE — Anesthesia Postprocedure Evaluation (Signed)
Anesthesia Post Note  Patient: Aimee Lewis  Procedure(s) Performed: VIDEO BRONCHOSCOPY WITH ENDOBRONCHIAL NAVIGATION WITH BIOPSIES (N/A )     Patient location during evaluation: PACU Anesthesia Type: General Level of consciousness: awake and alert Pain management: pain level controlled Vital Signs Assessment: post-procedure vital signs reviewed and stable Respiratory status: spontaneous breathing, nonlabored ventilation, respiratory function stable and patient connected to nasal cannula oxygen Cardiovascular status: blood pressure returned to baseline and stable Postop Assessment: no apparent nausea or vomiting Anesthetic complications: no    Last Vitals:  Vitals:   01/18/18 1053 01/18/18 1110  BP: 92/71 100/80  Pulse: 96 99  Resp: 20 20  Temp: 36.8 C   SpO2: 95% 94%    Last Pain:  Vitals:   01/18/18 1110  TempSrc:   PainSc: 0-No pain                 Damontae Loppnow

## 2018-01-20 NOTE — Telephone Encounter (Signed)
I reviewed the pathology with Ms. Wieman by phone.  The transbronchial brushings show evidence for non-small cell lung cancer.  This may be low stage disease given her reassuring PET scan.  I explained to her the diagnosis, the appropriate next steps.  She would like to be referred to the [facility] thoracic oncology clinic and I will make this referral now.  She will also need an MRI brain, pulmonary function testing.  If both are reassuring then this is may be stage II disease, making her a candidate for thoracic surgery.  She has an OV with S Groce next week.   Baltazar Apo, MD, PhD 01/20/2018, 3:43 PM [facility] [phone] or if no answer [phone]

## 2018-01-24 ENCOUNTER — Telehealth: Payer: Self-pay | Admitting: *Deleted

## 2018-01-24 ENCOUNTER — Other Ambulatory Visit: Payer: Self-pay | Admitting: Acute Care

## 2018-01-24 DIAGNOSIS — R918 Other nonspecific abnormal finding of lung field: Secondary | ICD-10-CM

## 2018-01-24 NOTE — Telephone Encounter (Signed)
Oncology Nurse Navigator Documentation  Oncology Nurse Navigator Flowsheets 01/24/2018  Navigator Location [facility]  Referral date to RadOnc/MedOnc 01/23/2018  Navigator Encounter Type Telephone/I received referral on Aimee Lewis yesterday. I called to schedule her to be seen at [facility].  I was unable to reach. I did leave vm message for her to call me with my name and phone number.   Telephone Outgoing Call  Treatment Phase Pre-Tx/Tx Discussion  Barriers/Navigation Needs Coordination of Care  Interventions Coordination of Care  Coordination of Care Other  Acuity Level 1  Time Spent with Patient 15

## 2018-01-25 ENCOUNTER — Ambulatory Visit (INDEPENDENT_AMBULATORY_CARE_PROVIDER_SITE_OTHER): Payer: BLUE CROSS/BLUE SHIELD | Admitting: Acute Care

## 2018-01-25 ENCOUNTER — Ambulatory Visit (HOSPITAL_COMMUNITY)
Admission: RE | Admit: 2018-01-25 | Discharge: 2018-01-25 | Disposition: A | Discharge status: Home / Self Care | Payer: BLUE CROSS/BLUE SHIELD | Source: Ambulatory Visit | Attending: Emergency Medicine | Admitting: Emergency Medicine

## 2018-01-25 ENCOUNTER — Encounter: Payer: Self-pay | Admitting: Acute Care

## 2018-01-25 DIAGNOSIS — F17210 Nicotine dependence, cigarettes, uncomplicated: Secondary | ICD-10-CM

## 2018-01-25 DIAGNOSIS — C3490 Malignant neoplasm of unspecified part of unspecified bronchus or lung: Secondary | ICD-10-CM | POA: Diagnosis not present

## 2018-01-25 DIAGNOSIS — I739 Peripheral vascular disease, unspecified: Secondary | ICD-10-CM | POA: Diagnosis not present

## 2018-01-25 DIAGNOSIS — C3492 Malignant neoplasm of unspecified part of left bronchus or lung: Secondary | ICD-10-CM

## 2018-01-25 MED ORDER — GADOBENATE DIMEGLUMINE 529 MG/ML IV SOLN
15.0000 mL | Freq: Once | INTRAVENOUS | Status: AC | PRN
  Administered 2018-01-25: 11 mL via INTRAVENOUS

## 2018-01-25 NOTE — Assessment & Plan Note (Addendum)
Patient is doing well post bronchoscopy No issues with hemoptysis, or dyspnea. Plan MRI of the brain today as is scheduled PFT's 4/29 as scheduled Follow-up thoracic clinic on 02/02/2018 Aimee Lewis will call and let you know  time and location for  May 2, appointment. Work on quitting smoking. Follow up with Dr. Lake Bells in 8 weeks Please contact office for sooner follow up if symptoms do not improve or worsen or seek emergency care

## 2018-01-25 NOTE — Progress Notes (Signed)
History of Present Illness Aimee Lewis is a 57 y.o. female current every day smoker with emphysema, chronic cough and recurrent pneumonia. She is followed by Dr. Lake Bells.   Synopsis: This is a pleasant 57 year old smoking female who has a persistent lesion seen on the left upper lobe CT scan and PET scanning several weeks after resolution of community-acquired pneumonia.  Given the duration of time after pneumonia treatment and the lack of pneumonia symptoms it was determined  that it is best that tissue sampling was obtained  to rule out malignancy.  Given her smoking history she is at increased risk for malignancy.  The differential diagnosis of this lesion is resolving pneumonia or scarring from a severe case of pneumonia, but it was determined that  her risk category is too high to sit and watch this with serial imaging. She had navigational bronch 01/18/2018.Samples obtained were as noted below: Samples: 1. Transbronchial needle brushings from left upper lobe infiltrate 2. Transbronchial Wang needle biopsies from left upper lobe infiltrate 3. Transbronchial forceps biopsies from left upper lobe infiltrate 4. Bronchoalveolar lavage from left upper lobe 5. Endobronchial biopsies from orifice of the left upper lobe bronchus 6.  Endobronchial brushings from orifice of the left upper lobe bronchus  Transbronchial brushings show evidence for non-small cell lung cancer.  This may be low stage disease given her reassuring PET scan.  Dr. Lamonte Sakai explained to her the diagnosis, the appropriate next steps.  She would like to be referred to the [facility] thoracic oncology clinic . Dr. Lamonte Sakai   made this referral.  She will also need an MRI brain, pulmonary function testing.  If both are reassuring then this is may be stage II disease, making her a candidate for thoracic surgery.         01/25/2018 Follow up after navigational bronch to evaluate for malignancy in smoker with recent pneumonia and  chronic cough.  Pt. Presents for follow up after bronch 01/18/2018. She is here with her husband.  She states she is doing well.  She has had no issues post bronchoscopy.  She had no complaints of hemoptysis,  or shortness of breath.  She feels she has recovered very well from the procedure.  She is using Flonase as needed for her allergies.  Her biggest concern today was follow-up regarding her recently diagnosed non-small cell lung cancer.  She is anxious to know the plan of care.  She is scheduled to see thoracic surgery at thoracic conference on 02/02/2018.  We have scheduled PFT's for prior to [facility] appointment which is on 02/02/2018. She has MRI brain scheduled for today, and has PFT's scheduled for 01/30/2018.  I have [place] with the hopes that she will call the patient with more specifics about the appointment.  Mr. Mrs. Scheiderer asked appropriate questions, and had no further questions at completion of the office visit.She is continuing to smoke. We discussed the need for smoking cessation in preparation for surgical intervention.   Test Results: CXR 01/18/2018 Density again noted in the left upper lobe as seen on prior CT. No pneumothorax following bronchoscopy. Right lung clear. Heart is normal size.  IMPRESSION: No pneumothorax following bronchoscopy.  Continued left upper lobe lesion.  CBC Latest Ref Rng & Units 01/16/2018 01/02/2018  WBC 4.0 - 10.5 K/uL 4.2 5.0  Hemoglobin 12.0 - 15.0 g/dL 13.0 13.9  Hematocrit 36.0 - 46.0 % 39.7 41.3  Platelets 150 - 400 K/uL 299 366.0    BMP Latest Ref  Rng & Units 01/16/2018 01/02/2018  Glucose 65 - 99 mg/dL 102(H) 98  BUN 6 - 20 mg/dL 13 17  Creatinine 0.44 - 1.00 mg/dL 0.75 0.76  Sodium 135 - 145 mmol/L 137 134(L)  Potassium 3.5 - 5.1 mmol/L 4.4 4.4  Chloride 101 - 111 mmol/L 102 95(L)  CO2 22 - 32 mmol/L 22 31  Calcium 8.9 - 10.3 mg/dL 9.8 10.2    BNP No results found for: BNP  ProBNP No results found for: PROBNP  PFT No  results found for: FEV1PRE, FEV1POST, FVCPRE, FVCPOST, TLC, DLCOUNC, PREFEV1FVCRT, PSTFEV1FVCRT  Dg Chest Port 1 View  Result Date: 01/18/2018 CLINICAL DATA:  Post left upper lobe bronchoscopy with biopsy EXAM: PORTABLE CHEST 1 VIEW COMPARISON:  CT 01/05/2018 FINDINGS: Density again noted in the left upper lobe as seen on prior CT. No pneumothorax following bronchoscopy. Right lung clear. Heart is normal size. IMPRESSION: No pneumothorax following bronchoscopy. Continued left upper lobe lesion. Electronically Signed   By: Rolm Baptise M.D.   On: 01/18/2018 10:44   Ct Super D Chest Wo Contrast  Result Date: 01/05/2018 CLINICAL DATA:  LEFT upper lobe mass. EXAM: CT CHEST WITHOUT CONTRAST TECHNIQUE: Multidetector CT imaging of the chest was performed using thin slice collimation for electromagnetic bronchoscopy planning purposes, without intravenous contrast. COMPARISON:  12/26/2017 FINDINGS: Cardiovascular: Coronary artery calcification and aortic atherosclerotic calcification. Mediastinum/Nodes: No axillary or supraclavicular adenopathy. No mediastinal hilar adenopathy. No pericardial effusion. Esophagus normal. Lungs/Pleura: Elongated reticulonodular lesion in the LEFT upper lobe measuring 4.5 x 1.9 cm unchanged in size from recent PET-CT scan. Airways are normal. There several scattered a subpleural nodules which are less than 5 mm. For example RIGHT upper lobe, image 71/3). Upper Abdomen: Limited view of the liver, kidneys, pancreas are unremarkable. Normal adrenal glands. Musculoskeletal: No aggressive osseous lesion. IMPRESSION: 1. Elongated reticulonodular lesion in the LEFT upper lobe as described on recent PET-CT scan. 2.  Aortic Atherosclerosis (ICD10-I70.0). Electronically Signed   By: Suzy Bouchard M.D.   On: 01/05/2018 09:03   Dg C-arm Bronchoscopy  Result Date: 01/18/2018 C-ARM BRONCHOSCOPY: Fluoroscopy was utilized by the requesting physician.  No radiographic interpretation.     Past  medical hx Past Medical History:  Diagnosis Date  . COPD (chronic obstructive pulmonary disease) ([place])   . Hypertension   . Pneumonia      Social History   Tobacco Use  . Smoking status: Current Every Day Smoker    Packs/day: 1.00    Types: Cigarettes  . Smokeless tobacco: Never Used  Substance Use Topics  . Alcohol use: Yes    Comment: socially  . Drug use: No    Ms.Barner reports that she has been smoking cigarettes.  She has been smoking about 1.00 pack per day. She has never used smokeless tobacco. She reports that she drinks alcohol. She reports that she does not use drugs.  Tobacco Cessation: Current every day smoker.  Past surgical hx, Family hx, Social hx all reviewed.  Current Outpatient Medications on File Prior to Visit  Medication Sig  . fluticasone (FLONASE) 50 MCG/ACT nasal spray Place 1 spray into both nostrils daily as needed for allergies or rhinitis.  Marland Kitchen ibuprofen (ADVIL,MOTRIN) 200 MG tablet Take 200 mg by mouth daily as needed for headache or moderate pain.  Marland Kitchen lisinopril (PRINIVIL,ZESTRIL) 40 MG tablet Take 40 mg by mouth daily.   No current facility-administered medications on file prior to visit.      Allergies  Allergen Reactions  . Codeine  Nausea And Vomiting  . Darvon [Propoxyphene] Nausea And Vomiting    Review Of Systems:  Constitutional:   No  weight loss, night sweats,  Fevers, chills, fatigue, or  lassitude.  HEENT:   No headaches,  Difficulty swallowing,  Tooth/dental problems, or  Sore throat,                No sneezing, itching, ear ache, nasal congestion, post nasal drip,   CV:  No chest pain,  Orthopnea, PND, swelling in lower extremities, anasarca, dizziness, palpitations, syncope.   GI  No heartburn, indigestion, abdominal pain, nausea, vomiting, diarrhea, change in bowel habits, loss of appetite, bloody stools.   Resp: No shortness of breath with exertion or at rest.  No excess mucus, no productive cough,  No non-productive  cough,  No coughing up of blood.  No change in color of mucus.  No wheezing.  No chest wall deformity  Skin: no rash or lesions.  GU: no dysuria, change in color of urine, no urgency or frequency.  No flank pain, no hematuria   MS:  No joint pain or swelling.  No decreased range of motion.  No back pain.  Psych:  No change in mood or affect. No depression or anxiety.  No memory loss.   Vital Signs BP 124/74 (BP Location: Left Arm, Cuff Size: Normal)   Pulse 77   Ht 5' 2.5" (1.588 m)   Wt 125 lb 6.4 oz (56.9 kg)   SpO2 100%   BMI 22.57 kg/m    Physical Exam:  General- No distress,  A&Ox3, pleasant ENT: No sinus tenderness, TM clear, pale nasal mucosa, no oral exudate,no post nasal drip, no LAN Cardiac: S1, S2, regular rate and rhythm, no murmur Chest: No wheeze/ rales/ dullness; no accessory muscle use, no nasal flaring, no sternal retractions Abd.: Soft Non-tender, nondistended, bowel sounds positive Ext: No clubbing cyanosis, edema Neuro:  normal strength Skin: No rashes, warm and dry Psych: normal mood and behavior, appropriately anxious   Assessment/Plan  Non-small cell lung cancer, left (HCC) Patient is doing well post bronchoscopy No issues with hemoptysis, or dyspnea. Plan MRI of the brain today as is scheduled PFT's 4/29 as scheduled Follow-up thoracic clinic on 02/02/2018 [place] will call and let you know  time and location for  May 2, appointment. Work on quitting smoking. Follow up with Dr. Lake Bells in 8 weeks Please contact office for sooner follow up if symptoms do not improve or worsen or seek emergency care      Magdalen Spatz, NP 01/25/2018  7:56 PM

## 2018-01-25 NOTE — Patient Instructions (Addendum)
It is nice to meet you today. MRI of the brain today as is scheduled PFT's 4/29 as scheduled Work on quitting smoking. Follow up with Dr. Lake Bells in 8 weeks Please contact office for sooner follow up if symptoms do not improve or worsen or seek emergency care

## 2018-01-26 ENCOUNTER — Encounter: Payer: Self-pay | Admitting: *Deleted

## 2018-01-26 NOTE — Progress Notes (Signed)
Oncology Nurse Navigator Documentation  Oncology Nurse Navigator Flowsheets 01/26/2018  Navigator Location [facility]  Navigator Encounter Type Other/MTOC letter sent out today.  Treatment Phase Pre-Tx/Tx Discussion  Barriers/Navigation Needs Education  Education Other  Interventions Education  Education Method Written  Acuity Level 1  Time Spent with Patient 15

## 2018-01-30 ENCOUNTER — Ambulatory Visit (INDEPENDENT_AMBULATORY_CARE_PROVIDER_SITE_OTHER): Payer: BLUE CROSS/BLUE SHIELD | Admitting: Pulmonary Disease

## 2018-01-30 DIAGNOSIS — R918 Other nonspecific abnormal finding of lung field: Secondary | ICD-10-CM

## 2018-01-30 LAB — PULMONARY FUNCTION TEST
DL/VA % pred: 93 %
DL/VA: 4.21 ml/min/mmHg/L
DLCO cor % pred: 102 %
DLCO cor: 21.77 ml/min/mmHg
DLCO unc % pred: 101 %
DLCO unc: 21.5 ml/min/mmHg
FEF 25-75 Post: 1.17 L/sec
FEF 25-75 Pre: 0.42 L/sec
FEF2575-%Change-Post: 176 %
FEF2575-%Pred-Post: 50 %
FEF2575-%Pred-Pre: 18 %
FEV1-%Change-Post: 53 %
FEV1-%Pred-Post: 60 %
FEV1-%Pred-Pre: 39 %
FEV1-Post: 1.46 L
FEV1-Pre: 0.95 L
FEV1FVC-%Change-Post: 4 %
FEV1FVC-%Pred-Pre: 68 %
FEV6-%Change-Post: 46 %
FEV6-%Pred-Post: 85 %
FEV6-%Pred-Pre: 58 %
FEV6-Post: 2.54 L
FEV6-Pre: 1.73 L
FEV6FVC-%Change-Post: 0 %
FEV6FVC-%Pred-Post: 102 %
FEV6FVC-%Pred-Pre: 102 %
FVC-%Change-Post: 46 %
FVC-%Pred-Post: 83 %
FVC-%Pred-Pre: 57 %
FVC-Post: 2.58 L
FVC-Pre: 1.77 L
Post FEV1/FVC ratio: 56 %
Post FEV6/FVC ratio: 98 %
Pre FEV1/FVC ratio: 54 %
Pre FEV6/FVC Ratio: 98 %
RV % pred: 236 %
RV: 4.36 L
TLC % pred: 137 %
TLC: 6.48 L

## 2018-01-30 NOTE — Progress Notes (Signed)
PFT done today. 

## 2018-01-31 NOTE — Telephone Encounter (Signed)
thanks

## 2018-02-02 ENCOUNTER — Ambulatory Visit
Payer: BLUE CROSS/BLUE SHIELD | Attending: Thoracic Surgery (Cardiothoracic Vascular Surgery) | Admitting: Physical Therapy

## 2018-02-02 ENCOUNTER — Other Ambulatory Visit: Payer: Self-pay | Admitting: *Deleted

## 2018-02-02 ENCOUNTER — Other Ambulatory Visit: Payer: Self-pay

## 2018-02-02 ENCOUNTER — Institutional Professional Consult (permissible substitution) (INDEPENDENT_AMBULATORY_CARE_PROVIDER_SITE_OTHER): Payer: BLUE CROSS/BLUE SHIELD | Admitting: Thoracic Surgery (Cardiothoracic Vascular Surgery)

## 2018-02-02 DIAGNOSIS — C3412 Malignant neoplasm of upper lobe, left bronchus or lung: Secondary | ICD-10-CM | POA: Diagnosis not present

## 2018-02-02 DIAGNOSIS — C3492 Malignant neoplasm of unspecified part of left bronchus or lung: Secondary | ICD-10-CM

## 2018-02-02 NOTE — Therapy (Signed)
[place], [place], [address] Phone: [phone]   Fax:  [phone]  Physical Therapy Evaluation  Patient Details  Name: Aimee Lewis MRN: [id number] Date of Birth: 1960-10-26 Referring Provider: Dr. Modesto Charon   Encounter Date: 02/02/2018  PT End of Session - 02/02/18 1700    Visit Number  1    Number of Visits  1    PT Start Time  1550    PT Stop Time  1613    PT Time Calculation (min)  23 min    Behavior During Therapy  [facility] for tasks assessed/performed       Past Medical History:  Diagnosis Date  . COPD (chronic obstructive pulmonary disease) ([place])   . Hypertension   . Pneumonia     Past Surgical History:  Procedure Laterality Date  . BACK SURGERY    . CATARACT EXTRACTION W/ INTRAOCULAR LENS  IMPLANT, BILATERAL Bilateral 09/2017  . TONSILLECTOMY    . VIDEO BRONCHOSCOPY WITH ENDOBRONCHIAL NAVIGATION N/A 01/18/2018   Procedure: VIDEO BRONCHOSCOPY WITH ENDOBRONCHIAL NAVIGATION WITH BIOPSIES;  Surgeon: Collene Gobble, MD;  Location: [facility] OR;  Service: Thoracic;  Laterality: N/A;    There were no vitals filed for this visit.   Subjective Assessment - 02/02/18 1650    Subjective  "I know that I have cancer." "I want to find out the results of my brain MRI and the treatment plan." "They told me if I have surgery that the nurses would teach me about that (cough splinting)."    Patient is accompained by:  Family member husband    Pertinent History  Pt. presented with pneumonia-like symptoms and workup showed left upper lobe nodule.  Pathology from brushing from left upper lobe showed malignant cells consistent with non-small cell carcinoma. She is staged 1b and is expected to have resection.  Current smoker, h/o COPD, HTN, and back surgery.    Patient Stated Goals  get info from all lung clinic providers    Currently in Pain?  No/denies         [facility] PT Assessment - 02/02/18 0001       Assessment   Medical Diagnosis  stage 1b non-small cell lung cancer    Referring Provider  Dr. Modesto Charon    Onset Date/Surgical Date  11/22/17    Hand Dominance  Right    Prior Therapy  none      Precautions   Precautions  Other (comment)    Precaution Comments  cancer precautions      Restrictions   Weight Bearing Restrictions  No      Balance Screen   Has the patient fallen in the past 6 months  No    Has the patient had a decrease in activity level because of a fear of falling?   No    Is the patient reluctant to leave their home because of a fear of falling?   No      Home Environment   Living Environment  Private residence    Living Arrangements  Spouse/significant other    Type of [place]  Two level      Prior Function   Level of [place] Requirements  works at [employer]  walks at work and does gardening for activity      Cognition   Overall Cognitive Status  Within [place] for  tasks assessed      Observation/Other Assessments   Observations  Woman who appears her stated age and somewhat anxious today.      Coordination   Gross Motor Movements are Fluid and Coordinated  Yes      Functional Tests   Functional tests  Sit to Stand      Sit to Stand   Comments  15 times in 30 seconds, about average for her age      Posture/Postural Control   Posture/Postural Control  Postural limitations    Postural Limitations  Forward head very slight      ROM / Strength   AROM / PROM / Strength  AROM      AROM   Overall AROM   Within functional limits for tasks performed for standing trunk AROM      Ambulation/Gait   Ambulation/Gait  Yes    Ambulation/Gait Assistance  7: Independent      Balance   Balance Assessed  Yes      Dynamic Standing Balance   Dynamic Standing - Comments  reaches forward 14 inches in standing, about average for age                Objective  measurements completed on examination: See above findings.              PT Education - 02/02/18 1700    Education provided  Yes    Education Details  energy conservation, walking, "Why exercise?" flyer, posture, breathing, cough splinting, PT info    Person(s) Educated  Patient;Spouse    Methods  Explanation;Handout    Comprehension  Verbalized understanding            Lung Clinic Goals - 02/02/18 1711      Patient will be able to verbalize understanding of the benefit of exercise to decrease fatigue.   Status  Achieved      Patient will be able to verbalize the importance of posture.   Status  Achieved      Patient will be able to demonstrate diaphragmatic breathing for improved lung function.   Status  Achieved      Patient will be able to verbalize understanding of the role of physical therapy to prevent functional decline and who to contact if physical therapy is needed.   Status  Achieved           Plan - 02/02/18 1701    Clinical Impression Statement  This is a 57 year-old woman with new diagnosis of non-small cell lung cancer, stage 1b, and she is expected to have resection. She appears healthy and did well with physical tests today, scoring about average for her age on assessments.  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  She was educated today about posture, breathing, the benefits of exercise and how to exercise.    Clinical Decision Making  Low    Rehab Potential  Excellent    PT Frequency  One time visit    PT Treatment/Interventions  Patient/family education    PT Next Visit Plan  No follow-up planned unless a need develops.    PT Home Exercise Plan  walking or other exercise program, breathing exercise    Consulted and Agree with Plan of Care  Patient       Patient will benefit from skilled therapeutic intervention in order to improve the following deficits and impairments:  Other  (comment)(new diagnosis  of lung cancer)  Visit Diagnosis: Malignant neoplasm of upper lobe of left lung ([place]) - Plan: PT plan of care cert/re-cert     Problem List Patient Active Problem List   Diagnosis Date Noted  . Non-small cell lung cancer, left ([place]) 01/25/2018  . Abnormal CT of the chest 01/18/2018    Aimee Lewis 02/02/2018, 5:13 PM  [place], [place], [address] Phone: [phone]   Fax:  [phone]  Name: Aimee Lewis MRN: [id number] Date of Birth: 12/26/1960  Serafina Royals, PT 02/02/18 5:13 PM

## 2018-02-02 NOTE — H&P (View-Only) (Signed)
PCP is Via, Lennette Bihari, MD Referring Provider is Collene Gobble, MD  No chief complaint on file.   HPI: 57 yo woman with a history of tobacco abuse, COPD, pneumonia and hypertension. She developed a productive cough in December 2018 and was treated for pneumonia in January. Treated with antibiotics. Symptoms took about 6 weeks to resolve. She had an abnormal CXR and had a CT in February which showed a 4.6 x 3.9 x 1.9 cm left upper lobe mass with solid and subsolid components. She had a follow up CT at 6 weeks and the mas was unchanged. On PET there was low level hypermetabolism with an SUV of 2.8. There was no activity in the hilar or mediastinal nodes. Dr. Lamonte Sakai did a navigational bronchoscopy and brushings were positive for non-small cell carcinoma.  She has no SOB with ADL, no wheezing, + cough, no hemoptysis. Denies change in appetite, weight loss, change in vision, headaches, unusual bone or joint pain.   Still smoking about 1 ppd (38 py)  Zubrod Score: At the time of surgery this patient's most appropriate activity status/level should be described as: [x]     0    Normal activity, no symptoms []     1    Restricted in physical strenuous activity but ambulatory, able to do out light work []     2    Ambulatory and capable of self care, unable to do work activities, up and about >50 % of waking hours                              []     3    Only limited self care, in bed greater than 50% of waking hours []     4    Completely disabled, no self care, confined to bed or chair []     5    Moribund  Past Medical History:  Diagnosis Date  . COPD (chronic obstructive pulmonary disease) ([place])   . Hypertension   . Pneumonia     Past Surgical History:  Procedure Laterality Date  . BACK SURGERY    . CATARACT EXTRACTION W/ INTRAOCULAR LENS  IMPLANT, BILATERAL Bilateral 09/2017  . TONSILLECTOMY    . VIDEO BRONCHOSCOPY WITH ENDOBRONCHIAL NAVIGATION N/A 01/18/2018   Procedure: VIDEO BRONCHOSCOPY WITH  ENDOBRONCHIAL NAVIGATION WITH BIOPSIES;  Surgeon: Collene Gobble, MD;  Location: [facility] OR;  Service: Thoracic;  Laterality: N/A;    No family history on file.  Social History Social History   Tobacco Use  . Smoking status: Current Every Day Smoker    Packs/day: 1.00    Types: Cigarettes  . Smokeless tobacco: Never Used  Substance Use Topics  . Alcohol use: Yes    Comment: socially  . Drug use: No    Current Outpatient Medications  Medication Sig Dispense Refill  . fluticasone (FLONASE) 50 MCG/ACT nasal spray Place 1 spray into both nostrils daily as needed for allergies or rhinitis.    Marland Kitchen ibuprofen (ADVIL,MOTRIN) 200 MG tablet Take 200 mg by mouth daily as needed for headache or moderate pain.    Marland Kitchen lisinopril (PRINIVIL,ZESTRIL) 40 MG tablet Take 40 mg by mouth daily.     No current facility-administered medications for this visit.     Allergies  Allergen Reactions  . Codeine Nausea And Vomiting  . Darvon [Propoxyphene] Nausea And Vomiting    Review of Systems  Constitutional: Negative for activity change, appetite change and unexpected weight  change.  HENT: Negative for trouble swallowing and voice change.   Respiratory: Positive for cough. Negative for chest tightness, shortness of breath and wheezing.   Cardiovascular: Negative for chest pain and leg swelling.  Gastrointestinal: Negative for abdominal distention and abdominal pain.  Genitourinary: Negative for difficulty urinating and dysuria.  Musculoskeletal: Negative for arthralgias and myalgias.  Neurological: Negative for weakness and headaches.  Hematological: Negative for adenopathy. Does not bruise/bleed easily.  All other systems reviewed and are negative.   There were no vitals taken for this visit. Physical Exam  Constitutional: She is oriented to person, place, and time. She appears well-developed and well-nourished. No distress.  HENT:  Head: Normocephalic and atraumatic.  Mouth/Throat: No oropharyngeal  exudate.  Eyes: Pupils are equal, round, and reactive to light. Conjunctivae and EOM are normal. No scleral icterus.  Neck: No thyromegaly present.  Cardiovascular: Normal rate, regular rhythm, normal heart sounds and intact distal pulses. Exam reveals no gallop.  No murmur heard. Pulmonary/Chest: Effort normal and breath sounds normal. No stridor. No respiratory distress. She has no wheezes.  Abdominal: Soft. She exhibits no distension. There is no tenderness.  Musculoskeletal: She exhibits no edema.  Lymphadenopathy:    She has no cervical adenopathy.  Neurological: She is alert and oriented to person, place, and time. No cranial nerve deficit. She exhibits normal muscle tone. Coordination normal.  Skin: Skin is warm and dry.  Vitals reviewed.    Diagnostic Tests: CT CHEST WITHOUT CONTRAST  TECHNIQUE: Multidetector CT imaging of the chest was performed using thin slice collimation for electromagnetic bronchoscopy planning purposes, without intravenous contrast.  COMPARISON:  12/26/2017  FINDINGS: Cardiovascular: Coronary artery calcification and aortic atherosclerotic calcification.  Mediastinum/Nodes: No axillary or supraclavicular adenopathy. No mediastinal hilar adenopathy. No pericardial effusion. Esophagus normal.  Lungs/Pleura: Elongated reticulonodular lesion in the LEFT upper lobe measuring 4.5 x 1.9 cm unchanged in size from recent PET-CT scan.  Airways are normal.  There several scattered a subpleural nodules which are less than 5 mm. For example RIGHT upper lobe, image 71/3).  Upper Abdomen: Limited view of the liver, kidneys, pancreas are unremarkable. Normal adrenal glands.  Musculoskeletal: No aggressive osseous lesion.  IMPRESSION: 1. Elongated reticulonodular lesion in the LEFT upper lobe as described on recent PET-CT scan. 2.  Aortic Atherosclerosis (ICD10-I70.0).   Electronically Signed   By: Suzy Bouchard M.D.   On:  01/05/2018 09:03 NUCLEAR MEDICINE PET SKULL BASE TO THIGH  TECHNIQUE: 6.2 mCi F-18 FDG was injected intravenously. Full-ring PET imaging was performed from the skull base to thigh after the radiotracer. CT data was obtained and used for attenuation correction and anatomic localization.  Fasting blood glucose: 77 mg/dl  Mediastinal blood pool activity: SUV max 1.95  COMPARISON:  No prior PET-CT.  Chest CT 11/22/2017.  FINDINGS: NECK: No hypermetabolic lymph nodes in the neck.  Incidental CT findings: none  CHEST: Previously noted mass-like mixed solid and sub solid lesion in the left upper lobe currently measures 4.6 x 2.1 cm and demonstrates low-level hypermetabolism (SUVmax = 2.8). No other suspicious appearing pulmonary nodules or masses are noted. No acute consolidative airspace disease. No pleural effusions.  Incidental CT findings: There is aortic atherosclerosis, as well as atherosclerosis of the great vessels of the mediastinum and the coronary arteries, including calcified atherosclerotic plaque in the left main, left anterior descending, left circumflex and right coronary arteries. Calcifications of the aortic valve. Mild diffuse bronchial wall thickening with mild centrilobular and paraseptal emphysema.  ABDOMEN/PELVIS: No abnormal  hypermetabolic activity within the liver, pancreas, adrenal glands, or spleen. No hypermetabolic lymph nodes in the abdomen or pelvis.  Incidental CT findings: none  SKELETON: No focal hypermetabolic activity to suggest skeletal metastasis.  Incidental CT findings: none  IMPRESSION: 1. The previously demonstrated mixed solid and subsolid mass in the left upper lobe is similar in size to the prior examination and demonstrates low-level hypermetabolism. These findings are highly concerning for primary bronchogenic carcinoma such as an adenocarcinoma. Further evaluation with biopsy is strongly recommended in the near  future. 2. No mediastinal or hilar lymphadenopathy, and no findings to suggest metastatic disease in the neck, chest, abdomen or pelvis. 3. Aortic atherosclerosis, in addition to left main and 3 vessel coronary artery disease. Please note that although the presence of coronary artery calcium documents the presence of coronary artery disease, the severity of this disease and any potential stenosis cannot be assessed on this non-gated CT examination. Assessment for potential risk factor modification, dietary therapy or pharmacologic therapy may be warranted, if clinically indicated. 4. There are calcifications of the aortic valve. Echocardiographic correlation for evaluation of potential valvular dysfunction may be warranted if clinically indicated. 5. Mild diffuse bronchial wall thickening with mild centrilobular and paraseptal emphysema; imaging findings suggestive of underlying COPD.   Electronically Signed   By: Vinnie Langton M.D.   On: 12/26/2017 10:02  I personally reviewed the CT and PET images and concur with the findings noted above  Pulmonary function tests FVC= 1.77 (57%) FEV1= 0.95 (39%), 1.46 (60%) post bronchodilator DLCO= 21.77 (102%)  Impression: 57 yo woman with 38 py history of smoking who presented with pneumonia back in January. Found to have a mixed solid, subsolid mass in the left upper lobe that is a non-small cell carcinoma, likely a low grade adenocarcinoma. Clinically stage IB(T2N0).  Surgery is best option for treatment. Radiation is another option and we discussed the advantages of each. She is a good candidate for a VATS approach.  I discussed the general nature of the procedure, the need for general anesthesia, the incisions to be used, and the use of a drainage tube postoperatively with Mrs Pun and her husband. We discussed the expected hospital stay, overall recovery and short and long term outcomes. I informed them of the indications, risks,  benefits and alternatives. They understand the risks include, but are not limited to death, stroke, MI, DVT/PE, bleeding, possible need for transfusion, infections, prolonged air leak, cardiac arrhythmias, as well as other organ system dysfunction including respiratory, renal, or GI complications.   She accepts the risks and agrees to proceed.   My office will call to schedule. She would like to have it done after May 16  Plan: Left VATS, Left upper lobectomy  Melrose Nakayama, MD [facility] [phone]

## 2018-02-02 NOTE — Progress Notes (Signed)
PCP is Via, Lennette Bihari, MD Referring Provider is Collene Gobble, MD  No chief complaint on file.   HPI: 57 yo woman with a history of tobacco abuse, COPD, pneumonia and hypertension. She developed a productive cough in December 2018 and was treated for pneumonia in January. Treated with antibiotics. Symptoms took about 6 weeks to resolve. She had an abnormal CXR and had a CT in February which showed a 4.6 x 3.9 x 1.9 cm left upper lobe mass with solid and subsolid components. She had a follow up CT at 6 weeks and the mas was unchanged. On PET there was low level hypermetabolism with an SUV of 2.8. There was no activity in the hilar or mediastinal nodes. Dr. Lamonte Sakai did a navigational bronchoscopy and brushings were positive for non-small cell carcinoma.  She has no SOB with ADL, no wheezing, + cough, no hemoptysis. Denies change in appetite, weight loss, change in vision, headaches, unusual bone or joint pain.   Still smoking about 1 ppd (38 py)  Zubrod Score: At the time of surgery this patient's most appropriate activity status/level should be described as: [x]     0    Normal activity, no symptoms []     1    Restricted in physical strenuous activity but ambulatory, able to do out light work []     2    Ambulatory and capable of self care, unable to do work activities, up and about >50 % of waking hours                              []     3    Only limited self care, in bed greater than 50% of waking hours []     4    Completely disabled, no self care, confined to bed or chair []     5    Moribund  Past Medical History:  Diagnosis Date  . COPD (chronic obstructive pulmonary disease) ([place])   . Hypertension   . Pneumonia     Past Surgical History:  Procedure Laterality Date  . BACK SURGERY    . CATARACT EXTRACTION W/ INTRAOCULAR LENS  IMPLANT, BILATERAL Bilateral 09/2017  . TONSILLECTOMY    . VIDEO BRONCHOSCOPY WITH ENDOBRONCHIAL NAVIGATION N/A 01/18/2018   Procedure: VIDEO BRONCHOSCOPY WITH  ENDOBRONCHIAL NAVIGATION WITH BIOPSIES;  Surgeon: Collene Gobble, MD;  Location: [facility] OR;  Service: Thoracic;  Laterality: N/A;    No family history on file.  Social History Social History   Tobacco Use  . Smoking status: Current Every Day Smoker    Packs/day: 1.00    Types: Cigarettes  . Smokeless tobacco: Never Used  Substance Use Topics  . Alcohol use: Yes    Comment: socially  . Drug use: No    Current Outpatient Medications  Medication Sig Dispense Refill  . fluticasone (FLONASE) 50 MCG/ACT nasal spray Place 1 spray into both nostrils daily as needed for allergies or rhinitis.    Marland Kitchen ibuprofen (ADVIL,MOTRIN) 200 MG tablet Take 200 mg by mouth daily as needed for headache or moderate pain.    Marland Kitchen lisinopril (PRINIVIL,ZESTRIL) 40 MG tablet Take 40 mg by mouth daily.     No current facility-administered medications for this visit.     Allergies  Allergen Reactions  . Codeine Nausea And Vomiting  . Darvon [Propoxyphene] Nausea And Vomiting    Review of Systems  Constitutional: Negative for activity change, appetite change and unexpected weight  change.  HENT: Negative for trouble swallowing and voice change.   Respiratory: Positive for cough. Negative for chest tightness, shortness of breath and wheezing.   Cardiovascular: Negative for chest pain and leg swelling.  Gastrointestinal: Negative for abdominal distention and abdominal pain.  Genitourinary: Negative for difficulty urinating and dysuria.  Musculoskeletal: Negative for arthralgias and myalgias.  Neurological: Negative for weakness and headaches.  Hematological: Negative for adenopathy. Does not bruise/bleed easily.  All other systems reviewed and are negative.   There were no vitals taken for this visit. Physical Exam  Constitutional: She is oriented to person, place, and time. She appears well-developed and well-nourished. No distress.  HENT:  Head: Normocephalic and atraumatic.  Mouth/Throat: No oropharyngeal  exudate.  Eyes: Pupils are equal, round, and reactive to light. Conjunctivae and EOM are normal. No scleral icterus.  Neck: No thyromegaly present.  Cardiovascular: Normal rate, regular rhythm, normal heart sounds and intact distal pulses. Exam reveals no gallop.  No murmur heard. Pulmonary/Chest: Effort normal and breath sounds normal. No stridor. No respiratory distress. She has no wheezes.  Abdominal: Soft. She exhibits no distension. There is no tenderness.  Musculoskeletal: She exhibits no edema.  Lymphadenopathy:    She has no cervical adenopathy.  Neurological: She is alert and oriented to person, place, and time. No cranial nerve deficit. She exhibits normal muscle tone. Coordination normal.  Skin: Skin is warm and dry.  Vitals reviewed.    Diagnostic Tests: CT CHEST WITHOUT CONTRAST  TECHNIQUE: Multidetector CT imaging of the chest was performed using thin slice collimation for electromagnetic bronchoscopy planning purposes, without intravenous contrast.  COMPARISON:  12/26/2017  FINDINGS: Cardiovascular: Coronary artery calcification and aortic atherosclerotic calcification.  Mediastinum/Nodes: No axillary or supraclavicular adenopathy. No mediastinal hilar adenopathy. No pericardial effusion. Esophagus normal.  Lungs/Pleura: Elongated reticulonodular lesion in the LEFT upper lobe measuring 4.5 x 1.9 cm unchanged in size from recent PET-CT scan.  Airways are normal.  There several scattered a subpleural nodules which are less than 5 mm. For example RIGHT upper lobe, image 71/3).  Upper Abdomen: Limited view of the liver, kidneys, pancreas are unremarkable. Normal adrenal glands.  Musculoskeletal: No aggressive osseous lesion.  IMPRESSION: 1. Elongated reticulonodular lesion in the LEFT upper lobe as described on recent PET-CT scan. 2.  Aortic Atherosclerosis (ICD10-I70.0).   Electronically Signed   By: Suzy Bouchard M.D.   On:  01/05/2018 09:03 NUCLEAR MEDICINE PET SKULL BASE TO THIGH  TECHNIQUE: 6.2 mCi F-18 FDG was injected intravenously. Full-ring PET imaging was performed from the skull base to thigh after the radiotracer. CT data was obtained and used for attenuation correction and anatomic localization.  Fasting blood glucose: 77 mg/dl  Mediastinal blood pool activity: SUV max 1.95  COMPARISON:  No prior PET-CT.  Chest CT 11/22/2017.  FINDINGS: NECK: No hypermetabolic lymph nodes in the neck.  Incidental CT findings: none  CHEST: Previously noted mass-like mixed solid and sub solid lesion in the left upper lobe currently measures 4.6 x 2.1 cm and demonstrates low-level hypermetabolism (SUVmax = 2.8). No other suspicious appearing pulmonary nodules or masses are noted. No acute consolidative airspace disease. No pleural effusions.  Incidental CT findings: There is aortic atherosclerosis, as well as atherosclerosis of the great vessels of the mediastinum and the coronary arteries, including calcified atherosclerotic plaque in the left main, left anterior descending, left circumflex and right coronary arteries. Calcifications of the aortic valve. Mild diffuse bronchial wall thickening with mild centrilobular and paraseptal emphysema.  ABDOMEN/PELVIS: No abnormal  hypermetabolic activity within the liver, pancreas, adrenal glands, or spleen. No hypermetabolic lymph nodes in the abdomen or pelvis.  Incidental CT findings: none  SKELETON: No focal hypermetabolic activity to suggest skeletal metastasis.  Incidental CT findings: none  IMPRESSION: 1. The previously demonstrated mixed solid and subsolid mass in the left upper lobe is similar in size to the prior examination and demonstrates low-level hypermetabolism. These findings are highly concerning for primary bronchogenic carcinoma such as an adenocarcinoma. Further evaluation with biopsy is strongly recommended in the near  future. 2. No mediastinal or hilar lymphadenopathy, and no findings to suggest metastatic disease in the neck, chest, abdomen or pelvis. 3. Aortic atherosclerosis, in addition to left main and 3 vessel coronary artery disease. Please note that although the presence of coronary artery calcium documents the presence of coronary artery disease, the severity of this disease and any potential stenosis cannot be assessed on this non-gated CT examination. Assessment for potential risk factor modification, dietary therapy or pharmacologic therapy may be warranted, if clinically indicated. 4. There are calcifications of the aortic valve. Echocardiographic correlation for evaluation of potential valvular dysfunction may be warranted if clinically indicated. 5. Mild diffuse bronchial wall thickening with mild centrilobular and paraseptal emphysema; imaging findings suggestive of underlying COPD.   Electronically Signed   By: Vinnie Langton M.D.   On: 12/26/2017 10:02  I personally reviewed the CT and PET images and concur with the findings noted above  Pulmonary function tests FVC= 1.77 (57%) FEV1= 0.95 (39%), 1.46 (60%) post bronchodilator DLCO= 21.77 (102%)  Impression: 57 yo woman with 38 py history of smoking who presented with pneumonia back in January. Found to have a mixed solid, subsolid mass in the left upper lobe that is a non-small cell carcinoma, likely a low grade adenocarcinoma. Clinically stage IB(T2N0).  Surgery is best option for treatment. Radiation is another option and we discussed the advantages of each. She is a good candidate for a VATS approach.  I discussed the general nature of the procedure, the need for general anesthesia, the incisions to be used, and the use of a drainage tube postoperatively with Mrs Aimee Lewis and her husband. We discussed the expected hospital stay, overall recovery and short and long term outcomes. I informed them of the indications, risks,  benefits and alternatives. They understand the risks include, but are not limited to death, stroke, MI, DVT/PE, bleeding, possible need for transfusion, infections, prolonged air leak, cardiac arrhythmias, as well as other organ system dysfunction including respiratory, renal, or GI complications.   She accepts the risks and agrees to proceed.   My office will call to schedule. She would like to have it done after May 16  Plan: Left VATS, Left upper lobectomy  Melrose Nakayama, MD [facility] [phone]

## 2018-02-03 ENCOUNTER — Encounter: Payer: Self-pay | Admitting: *Deleted

## 2018-02-03 ENCOUNTER — Other Ambulatory Visit: Payer: Self-pay | Admitting: *Deleted

## 2018-02-03 DIAGNOSIS — C3492 Malignant neoplasm of unspecified part of left bronchus or lung: Secondary | ICD-10-CM

## 2018-02-03 NOTE — Progress Notes (Signed)
Oncology Nurse Navigator Documentation  Oncology Nurse Navigator Flowsheets 02/03/2018  Navigator Location [facility]  Navigator Encounter Type Clinic/MDC/I spoke with patient and her husband yesterday at thoracic clinic. I gave and explained information for next steps.    Abnormal Finding Date 11/22/2017  Confirmed Diagnosis Date 01/18/2018  Surgery Date 02/15/2018  Multidisiplinary Clinic Date 02/02/2018  Treatment Initiated Date 02/15/2018  Patient Visit Type MedOnc  Treatment Phase Pre-Tx/Tx Discussion  Barriers/Navigation Needs Education  Education Understanding Cancer/ Treatment Options;Other  Interventions Education  Education Method Verbal;Written  Acuity Level 2  Time Spent with Patient 30

## 2018-02-07 ENCOUNTER — Telehealth: Payer: Self-pay | Admitting: *Deleted

## 2018-02-07 NOTE — Telephone Encounter (Signed)
Oncology Nurse Navigator Documentation  Oncology Nurse Navigator Flowsheets 02/07/2018  Navigator Location [facility]  Navigator Encounter Type Telephone/I called to follow up with Aimee Lewis. I wanted to help her quit smoking and educate her on upcoming surgery.  I was unable to reach her but did leave her a vm message to call with my name and phone number.   Telephone Outgoing Call  Treatment Phase Pre-Tx/Tx Discussion  Barriers/Navigation Needs Education  Education Other  Interventions Education  Education Method Verbal  Acuity Level 1  Time Spent with Patient 15

## 2018-02-09 ENCOUNTER — Telehealth: Payer: Self-pay | Admitting: *Deleted

## 2018-02-09 NOTE — Telephone Encounter (Signed)
Oncology Nurse Navigator Documentation  Oncology Nurse Navigator Flowsheets 02/09/2018  Navigator Location [facility]  Navigator Encounter Type Telephone/I received a message from Aimee Lewis. I called her back but was unable to reach her but did leave vm message for her to call me.  I want to help her quit smoking before her surgery and to update her on what to expect.   Telephone Outgoing Call  Treatment Phase Pre-Tx/Tx Discussion  Barriers/Navigation Needs Education  Education Other  Interventions Education  Education Method Verbal  Acuity Level 1  Time Spent with Patient 15

## 2018-02-10 ENCOUNTER — Telehealth: Payer: Self-pay | Admitting: *Deleted

## 2018-02-10 NOTE — Telephone Encounter (Signed)
Oncology Nurse Navigator Documentation  Oncology Nurse Navigator Flowsheets 02/10/2018  Navigator Location [facility]  Navigator Encounter Type Telephone/I spoke with patient today regarding her upcoming surgery.  I updated her on what to expect.  I also spoke to her about smoking cessation. She states she has been doing better but has not completely quit.  I spoke and gave her tips and encouraged her to quit. I gave her information on the benefits of quitting. Aimee Lewis was thankful for the call and information.   Telephone Incoming Call;Outgoing Call  Treatment Phase Pre-Tx/Tx Discussion  Barriers/Navigation Needs Education  Education Understanding Cancer/ Treatment Options;Pain/ Symptom Management;Other  Interventions Education  Education Method Verbal  Acuity Level 1  Time Spent with Patient 85

## 2018-02-10 NOTE — Pre-Procedure Instructions (Signed)
Aimee Lewis  02/10/2018      [facility] [address] [place], [place] [address] Phone: [phone] Fax: [phone]    Your procedure is scheduled on 02/15/2018.  Report to [facility] Admitting at 0930 A.M.  Call this number if you have problems the morning of surgery:  [phone]   Remember:  Do not eat food or drink liquids after midnight.   Continue all medications as directed by your physician except follow these medication instructions before surgery below   Take these medicines the morning of surgery with A SIP OF WATER: Fluticasone (Flonase) nasal spray - if needed Fluticasone-Salmeterol (Advair) inhaler - if needed  7 days prior to surgery STOP taking any Aspirin (unless otherwise instructed by your surgeon), Aleve, Naproxen, Ibuprofen, Motrin, Advil, Goody's, BC's, all herbal medications, fish oil, and all vitamins    Do not wear jewelry, make-up or nail polish.  Do not wear lotions, powders, or perfumes, or deodorant.  Do not shave 48 hours prior to surgery.    Do not bring valuables to the hospital.  [facility] is not responsible for any belongings or valuables.  Hearing aids, eyeglasses, contacts, dentures or bridgework may not be worn into surgery.  Leave your suitcase in the car.  After surgery it may be brought to your room.  For patients admitted to the hospital, discharge time will be determined by your treatment team.  Patients discharged the day of surgery will not be allowed to drive home.   Name and phone number of your driver:    Special instructions:   [facility]- Preparing For Surgery  Before surgery, you can play an important role. Because skin is not sterile, your skin needs to be as free of germs as possible. You can reduce the number of germs on your skin by washing with CHG (chlorahexidine gluconate) Soap before surgery.  CHG is an antiseptic cleaner which  kills germs and bonds with the skin to continue killing germs even after washing.  Oral Hygiene is also important to reduce your risk of infection.  Remember - BRUSH YOUR TEETH THE MORNING OF SURGERY  Please do not use if you have an allergy to CHG or antibacterial soaps. If your skin becomes reddened/irritated stop using the CHG.  Do not shave (including legs and underarms) for at least 48 hours prior to first CHG shower. It is OK to shave your face.  Please follow these instructions carefully.   1. Shower the NIGHT BEFORE SURGERY and the MORNING OF SURGERY with CHG.   2. If you chose to wash your hair, wash your hair first as usual with your normal shampoo.  3. After you shampoo, rinse your hair and body thoroughly to remove the shampoo.  4. Use CHG as you would any other liquid soap. You can apply CHG directly to the skin and wash gently with a scrungie or a clean washcloth.   5. Apply the CHG Soap to your body ONLY FROM THE NECK DOWN.  Do not use on open wounds or open sores. Avoid contact with your eyes, ears, mouth and genitals (private parts). Wash Face and genitals (private parts)  with your normal soap.  6. Wash thoroughly, paying special attention to the area where your surgery will be performed.  7. Thoroughly rinse your body with warm water from the neck down.  8. DO NOT shower/wash with your normal soap after using  and rinsing off the CHG Soap.  9. Pat yourself dry with a CLEAN TOWEL.  10. Wear CLEAN PAJAMAS to bed the night before surgery, wear comfortable clothes the morning of surgery  11. Place CLEAN SHEETS on your bed the night of your first shower and DO NOT SLEEP WITH PETS.    Day of Surgery: Shower as stated above. Do not apply any deodorants/lotions. Please wear clean clothes to the hospital/surgery center.  Remember to brush your teeth.      Please read over the following fact sheets that you were given.

## 2018-02-13 ENCOUNTER — Ambulatory Visit (HOSPITAL_COMMUNITY)
Admission: RE | Admit: 2018-02-13 | Discharge: 2018-02-13 | Disposition: A | Discharge status: Home / Self Care | Payer: BLUE CROSS/BLUE SHIELD | Source: Ambulatory Visit | Attending: Thoracic Surgery (Cardiothoracic Vascular Surgery) | Admitting: Thoracic Surgery (Cardiothoracic Vascular Surgery)

## 2018-02-13 ENCOUNTER — Encounter (HOSPITAL_COMMUNITY): Payer: Self-pay

## 2018-02-13 ENCOUNTER — Other Ambulatory Visit: Payer: Self-pay

## 2018-02-13 ENCOUNTER — Encounter (HOSPITAL_COMMUNITY)
Admission: RE | Admit: 2018-02-13 | Discharge: 2018-02-13 | Disposition: A | Discharge status: Home / Self Care | Payer: BLUE CROSS/BLUE SHIELD | Source: Ambulatory Visit | Attending: Thoracic Surgery (Cardiothoracic Vascular Surgery) | Admitting: Thoracic Surgery (Cardiothoracic Vascular Surgery)

## 2018-02-13 DIAGNOSIS — Z01812 Encounter for preprocedural laboratory examination: Secondary | ICD-10-CM | POA: Insufficient documentation

## 2018-02-13 DIAGNOSIS — Z01818 Encounter for other preprocedural examination: Secondary | ICD-10-CM | POA: Diagnosis not present

## 2018-02-13 DIAGNOSIS — C3492 Malignant neoplasm of unspecified part of left bronchus or lung: Secondary | ICD-10-CM | POA: Diagnosis not present

## 2018-02-13 DIAGNOSIS — J449 Chronic obstructive pulmonary disease, unspecified: Secondary | ICD-10-CM | POA: Insufficient documentation

## 2018-02-13 LAB — CBC
HCT: 40.3 % (ref 36.0–46.0)
Hemoglobin: 13.3 g/dL (ref 12.0–15.0)
MCH: 32.8 pg (ref 26.0–34.0)
MCHC: 33 g/dL (ref 30.0–36.0)
MCV: 99.3 fL (ref 78.0–100.0)
Platelets: 283 10*3/uL (ref 150–400)
RBC: 4.06 MIL/uL (ref 3.87–5.11)
RDW: 13.3 % (ref 11.5–15.5)
WBC: 5.3 10*3/uL (ref 4.0–10.5)

## 2018-02-13 LAB — BLOOD GAS, ARTERIAL
Acid-Base Excess: 2.7 mmol/L — ABNORMAL HIGH (ref 0.0–2.0)
Bicarbonate: 26.9 mmol/L (ref 20.0–28.0)
Drawn by: 470591
FIO2: 21
O2 Saturation: 96.3 %
Patient temperature: 98.6
pCO2 arterial: 42.6 mmHg (ref 32.0–48.0)
pH, Arterial: 7.416 (ref 7.350–7.450)
pO2, Arterial: 81 mmHg — ABNORMAL LOW (ref 83.0–108.0)

## 2018-02-13 LAB — APTT: aPTT: 27 seconds (ref 24–36)

## 2018-02-13 LAB — URINALYSIS, ROUTINE W REFLEX MICROSCOPIC
Bacteria, UA: NONE SEEN
Bilirubin Urine: NEGATIVE
Glucose, UA: NEGATIVE mg/dL
Hgb urine dipstick: NEGATIVE
Ketones, ur: NEGATIVE mg/dL
Leukocytes, UA: NEGATIVE
Nitrite: NEGATIVE
Protein, ur: NEGATIVE mg/dL
Specific Gravity, Urine: 1.018 (ref 1.005–1.030)
pH: 7 (ref 5.0–8.0)

## 2018-02-13 LAB — COMPREHENSIVE METABOLIC PANEL
ALT: 9 U/L — ABNORMAL LOW (ref 14–54)
AST: 28 U/L (ref 15–41)
Albumin: 4.3 g/dL (ref 3.5–5.0)
Alkaline Phosphatase: 51 U/L (ref 38–126)
Anion gap: 11 (ref 5–15)
BUN: 15 mg/dL (ref 6–20)
CO2: 23 mmol/L (ref 22–32)
Calcium: 9.7 mg/dL (ref 8.9–10.3)
Chloride: 102 mmol/L (ref 101–111)
Creatinine, Ser: 0.8 mg/dL (ref 0.44–1.00)
GFR calc Af Amer: >60 mL/min (ref >60)
GFR calc non Af Amer: >60 mL/min (ref >60)
Glucose, Bld: 103 mg/dL — ABNORMAL HIGH (ref 65–99)
Potassium: 4.8 mmol/L (ref 3.5–5.1)
Sodium: 136 mmol/L (ref 135–145)
Total Bilirubin: 1.2 mg/dL (ref 0.3–1.2)
Total Protein: 6.8 g/dL (ref 6.5–8.1)

## 2018-02-13 LAB — SURGICAL PCR SCREEN
MRSA, PCR: NEGATIVE
Staphylococcus aureus: NEGATIVE

## 2018-02-13 LAB — PROTIME-INR
INR: 0.94
Prothrombin Time: 12.5 seconds (ref 11.4–15.2)

## 2018-02-13 LAB — TYPE AND SCREEN
ABO/RH(D): O POS
Antibody Screen: NEGATIVE

## 2018-02-13 LAB — ABO/RH: ABO/RH(D): O POS

## 2018-02-13 NOTE — Progress Notes (Signed)
PCP - Dr. Lynelle Doctor Cardiologist - patient denies   Chest x-ray - 02/13/2018 EKG - 01/16/2018 Stress Test - patient denies ECHO - patient denies Cardiac Cath - patient denies  Sleep Study - patient denies  Blood Thinner Instructions:n/a Aspirin Instructions: n/a  Anesthesia review: n/a  Patient denies shortness of breath, fever, cough and chest pain at PAT appointment   Patient verbalized understanding of instructions that were given to them at the PAT appointment. Patient was also instructed that they will need to review over the PAT instructions again at home before surgery.

## 2018-02-15 ENCOUNTER — Inpatient Hospital Stay (HOSPITAL_COMMUNITY)
Admission: RE | Admit: 2018-02-15 | Discharge: 2018-02-19 | DRG: 164 | Disposition: A | Discharge status: Home / Self Care | Payer: BLUE CROSS/BLUE SHIELD | Source: Ambulatory Visit | Attending: Thoracic Surgery (Cardiothoracic Vascular Surgery) | Admitting: Thoracic Surgery (Cardiothoracic Vascular Surgery)

## 2018-02-15 ENCOUNTER — Inpatient Hospital Stay (HOSPITAL_COMMUNITY): Payer: BLUE CROSS/BLUE SHIELD | Admitting: Certified Registered"

## 2018-02-15 ENCOUNTER — Encounter (HOSPITAL_COMMUNITY)
Admission: RE | Disposition: A | Discharge status: Home / Self Care | Payer: Self-pay | Source: Ambulatory Visit | Attending: Thoracic Surgery (Cardiothoracic Vascular Surgery)

## 2018-02-15 ENCOUNTER — Inpatient Hospital Stay (HOSPITAL_COMMUNITY): Payer: BLUE CROSS/BLUE SHIELD

## 2018-02-15 ENCOUNTER — Other Ambulatory Visit: Payer: Self-pay

## 2018-02-15 ENCOUNTER — Encounter (HOSPITAL_COMMUNITY): Payer: Self-pay

## 2018-02-15 DIAGNOSIS — I10 Essential (primary) hypertension: Secondary | ICD-10-CM | POA: Diagnosis not present

## 2018-02-15 DIAGNOSIS — Z885 Allergy status to narcotic agent status: Secondary | ICD-10-CM

## 2018-02-15 DIAGNOSIS — Z7951 Long term (current) use of inhaled steroids: Secondary | ICD-10-CM

## 2018-02-15 DIAGNOSIS — Z961 Presence of intraocular lens: Secondary | ICD-10-CM | POA: Diagnosis not present

## 2018-02-15 DIAGNOSIS — I2510 Atherosclerotic heart disease of native coronary artery without angina pectoris: Secondary | ICD-10-CM | POA: Diagnosis not present

## 2018-02-15 DIAGNOSIS — E876 Hypokalemia: Secondary | ICD-10-CM | POA: Diagnosis not present

## 2018-02-15 DIAGNOSIS — Z886 Allergy status to analgesic agent status: Secondary | ICD-10-CM | POA: Diagnosis not present

## 2018-02-15 DIAGNOSIS — F17210 Nicotine dependence, cigarettes, uncomplicated: Secondary | ICD-10-CM | POA: Diagnosis present

## 2018-02-15 DIAGNOSIS — Z9841 Cataract extraction status, right eye: Secondary | ICD-10-CM | POA: Diagnosis not present

## 2018-02-15 DIAGNOSIS — D62 Acute posthemorrhagic anemia: Secondary | ICD-10-CM | POA: Diagnosis not present

## 2018-02-15 DIAGNOSIS — C3412 Malignant neoplasm of upper lobe, left bronchus or lung: Principal | ICD-10-CM | POA: Diagnosis present

## 2018-02-15 DIAGNOSIS — Z09 Encounter for follow-up examination after completed treatment for conditions other than malignant neoplasm: Secondary | ICD-10-CM

## 2018-02-15 DIAGNOSIS — Z79899 Other long term (current) drug therapy: Secondary | ICD-10-CM

## 2018-02-15 DIAGNOSIS — J449 Chronic obstructive pulmonary disease, unspecified: Secondary | ICD-10-CM | POA: Diagnosis present

## 2018-02-15 DIAGNOSIS — I700 Atherosclerosis of aorta: Secondary | ICD-10-CM | POA: Diagnosis present

## 2018-02-15 DIAGNOSIS — Z8701 Personal history of pneumonia (recurrent): Secondary | ICD-10-CM

## 2018-02-15 DIAGNOSIS — R918 Other nonspecific abnormal finding of lung field: Secondary | ICD-10-CM | POA: Diagnosis present

## 2018-02-15 DIAGNOSIS — J9382 Other air leak: Secondary | ICD-10-CM | POA: Diagnosis not present

## 2018-02-15 DIAGNOSIS — Z9842 Cataract extraction status, left eye: Secondary | ICD-10-CM | POA: Diagnosis not present

## 2018-02-15 DIAGNOSIS — C3492 Malignant neoplasm of unspecified part of left bronchus or lung: Secondary | ICD-10-CM

## 2018-02-15 DIAGNOSIS — Z902 Acquired absence of lung [part of]: Secondary | ICD-10-CM

## 2018-02-15 DIAGNOSIS — J939 Pneumothorax, unspecified: Secondary | ICD-10-CM | POA: Diagnosis not present

## 2018-02-15 HISTORY — PX: VIDEO ASSISTED THORACOSCOPY (VATS)/ LOBECTOMY: SHX6169

## 2018-02-15 SURGERY — VIDEO ASSISTED THORACOSCOPY (VATS)/ LOBECTOMY
Anesthesia: General | Site: Chest | Laterality: Left

## 2018-02-15 MED ORDER — SUFENTANIL CITRATE 50 MCG/ML IV SOLN
INTRAVENOUS | Status: DC | PRN
  Administered 2018-02-15: 20 ug via INTRAVENOUS
  Administered 2018-02-15 (×2): 10 ug via INTRAVENOUS

## 2018-02-15 MED ORDER — ENOXAPARIN SODIUM 30 MG/0.3ML ~~LOC~~ SOLN
30.0000 mg | SUBCUTANEOUS | Status: DC
  Administered 2018-02-16: 30 mg via SUBCUTANEOUS
  Filled 2018-02-15: qty 0.3

## 2018-02-15 MED ORDER — HYDROMORPHONE HCL 2 MG/ML IJ SOLN
0.2500 mg | INTRAMUSCULAR | Status: DC | PRN
  Administered 2018-02-15 (×2): 0.5 mg via INTRAVENOUS

## 2018-02-15 MED ORDER — PHENYLEPHRINE HCL 10 MG/ML IJ SOLN
INTRAMUSCULAR | Status: DC | PRN
  Administered 2018-02-15: 50 ug/min via INTRAVENOUS

## 2018-02-15 MED ORDER — ALBUTEROL SULFATE (2.5 MG/3ML) 0.083% IN NEBU
2.5000 mg | INHALATION_SOLUTION | RESPIRATORY_TRACT | Status: DC
  Administered 2018-02-15 – 2018-02-16 (×2): 2.5 mg via RESPIRATORY_TRACT
  Filled 2018-02-15 (×2): qty 3

## 2018-02-15 MED ORDER — DEXAMETHASONE SODIUM PHOSPHATE 10 MG/ML IJ SOLN
INTRAMUSCULAR | Status: DC | PRN
  Administered 2018-02-15: 10 mg via INTRAVENOUS

## 2018-02-15 MED ORDER — FLUTICASONE PROPIONATE 50 MCG/ACT NA SUSP: 1.0000 | Freq: Every day | NASAL | Status: DC | PRN

## 2018-02-15 MED ORDER — MIDAZOLAM HCL 2 MG/2ML IJ SOLN
2.0000 mg | Freq: Once | INTRAMUSCULAR | Status: AC
  Administered 2018-02-15: 2 mg via INTRAVENOUS
  Filled 2018-02-15: qty 2

## 2018-02-15 MED ORDER — PROPOFOL 10 MG/ML IV BOLUS
INTRAVENOUS | Status: AC
  Filled 2018-02-15: qty 20

## 2018-02-15 MED ORDER — SODIUM CHLORIDE 0.9 % IV SOLN: INTRAVENOUS | Status: DC

## 2018-02-15 MED ORDER — LISINOPRIL 20 MG PO TABS
40.0000 mg | ORAL_TABLET | Freq: Every day | ORAL | Status: DC
  Administered 2018-02-16 – 2018-02-19 (×4): 40 mg via ORAL
  Filled 2018-02-15 (×2): qty 2
  Filled 2018-02-15: qty 4
  Filled 2018-02-15: qty 2

## 2018-02-15 MED ORDER — SUGAMMADEX SODIUM 200 MG/2ML IV SOLN
INTRAVENOUS | Status: DC | PRN
  Administered 2018-02-15: 200 mg via INTRAVENOUS

## 2018-02-15 MED ORDER — ACETAMINOPHEN 500 MG PO TABS
1000.0000 mg | ORAL_TABLET | Freq: Four times a day (QID) | ORAL | Status: DC
  Administered 2018-02-15 – 2018-02-19 (×11): 1000 mg via ORAL
  Filled 2018-02-15 (×12): qty 2

## 2018-02-15 MED ORDER — METOCLOPRAMIDE HCL 5 MG/ML IJ SOLN
10.0000 mg | Freq: Four times a day (QID) | INTRAMUSCULAR | Status: AC
  Administered 2018-02-15: 10 mg via INTRAVENOUS
  Filled 2018-02-15 (×2): qty 2

## 2018-02-15 MED ORDER — SODIUM CHLORIDE 0.9 % IJ SOLN
INTRAMUSCULAR | Status: DC | PRN
  Administered 2018-02-15: 50 mL via INTRAVENOUS

## 2018-02-15 MED ORDER — LIDOCAINE 2% (20 MG/ML) 5 ML SYRINGE
INTRAMUSCULAR | Status: AC
  Filled 2018-02-15: qty 5

## 2018-02-15 MED ORDER — ROCURONIUM BROMIDE 100 MG/10ML IV SOLN
INTRAVENOUS | Status: DC | PRN
  Administered 2018-02-15: 10 mg via INTRAVENOUS
  Administered 2018-02-15: 50 mg via INTRAVENOUS
  Administered 2018-02-15: 20 mg via INTRAVENOUS
  Administered 2018-02-15 (×2): 10 mg via INTRAVENOUS

## 2018-02-15 MED ORDER — CEFAZOLIN SODIUM-DEXTROSE 2-4 GM/100ML-% IV SOLN
2.0000 g | INTRAVENOUS | Status: AC
  Administered 2018-02-15: 2 g via INTRAVENOUS
  Filled 2018-02-15: qty 100

## 2018-02-15 MED ORDER — PHENYLEPHRINE 40 MCG/ML (10ML) SYRINGE FOR IV PUSH (FOR BLOOD PRESSURE SUPPORT)
PREFILLED_SYRINGE | INTRAVENOUS | Status: DC | PRN
  Administered 2018-02-15: 120 ug via INTRAVENOUS
  Administered 2018-02-15 (×3): 80 ug via INTRAVENOUS

## 2018-02-15 MED ORDER — VECURONIUM BROMIDE 10 MG IV SOLR
INTRAVENOUS | Status: AC
  Filled 2018-02-15: qty 10

## 2018-02-15 MED ORDER — DIPHENHYDRAMINE HCL 50 MG/ML IJ SOLN: 12.5000 mg | Freq: Four times a day (QID) | INTRAMUSCULAR | Status: DC | PRN

## 2018-02-15 MED ORDER — VECURONIUM BROMIDE 10 MG IV SOLR
INTRAVENOUS | Status: DC | PRN
  Administered 2018-02-15: 3 mg via INTRAVENOUS

## 2018-02-15 MED ORDER — PROPOFOL 10 MG/ML IV BOLUS
INTRAVENOUS | Status: DC | PRN
  Administered 2018-02-15: 100 mg via INTRAVENOUS

## 2018-02-15 MED ORDER — SUGAMMADEX SODIUM 200 MG/2ML IV SOLN
INTRAVENOUS | Status: AC
  Filled 2018-02-15: qty 2

## 2018-02-15 MED ORDER — ROCURONIUM BROMIDE 50 MG/5ML IV SOLN
INTRAVENOUS | Status: AC
  Filled 2018-02-15: qty 1

## 2018-02-15 MED ORDER — MEPERIDINE HCL 50 MG/ML IJ SOLN: 6.2500 mg | INTRAMUSCULAR | Status: DC | PRN

## 2018-02-15 MED ORDER — LIDOCAINE 2% (20 MG/ML) 5 ML SYRINGE
INTRAMUSCULAR | Status: DC | PRN
  Administered 2018-02-15: 100 mg via INTRAVENOUS

## 2018-02-15 MED ORDER — 0.9 % SODIUM CHLORIDE (POUR BTL) OPTIME
TOPICAL | Status: DC | PRN
  Administered 2018-02-15: 2000 mL

## 2018-02-15 MED ORDER — FENTANYL 40 MCG/ML IV SOLN
INTRAVENOUS | Status: DC
  Administered 2018-02-15: 60 ug via INTRAVENOUS
  Administered 2018-02-15: 45 ug via INTRAVENOUS
  Administered 2018-02-15: 1000 ug via INTRAVENOUS
  Administered 2018-02-16: 75 ug via INTRAVENOUS
  Administered 2018-02-16: 45 ug via INTRAVENOUS
  Administered 2018-02-16: 30 ug via INTRAVENOUS
  Administered 2018-02-16: 45 ug via INTRAVENOUS
  Administered 2018-02-16: 105 ug via INTRAVENOUS
  Administered 2018-02-17: 30 ug via INTRAVENOUS
  Administered 2018-02-17: 45 ug via INTRAVENOUS
  Administered 2018-02-17: 75 ug via INTRAVENOUS
  Administered 2018-02-17 (×2): 30 ug via INTRAVENOUS
  Administered 2018-02-17 – 2018-02-18 (×2): 15 ug via INTRAVENOUS
  Administered 2018-02-18: 45 ug via INTRAVENOUS
  Filled 2018-02-15: qty 25

## 2018-02-15 MED ORDER — NALOXONE HCL 0.4 MG/ML IJ SOLN: 0.4000 mg | INTRAMUSCULAR | Status: DC | PRN

## 2018-02-15 MED ORDER — ONDANSETRON HCL 4 MG/2ML IJ SOLN
INTRAMUSCULAR | Status: DC | PRN
  Administered 2018-02-15: 4 mg via INTRAVENOUS

## 2018-02-15 MED ORDER — BUPIVACAINE LIPOSOME 1.3 % IJ SUSP
20.0000 mL | INTRAMUSCULAR | Status: AC
  Administered 2018-02-15: 20 mL
  Filled 2018-02-15: qty 20

## 2018-02-15 MED ORDER — CEFAZOLIN SODIUM-DEXTROSE 2-3 GM-%(50ML) IV SOLR
INTRAVENOUS | Status: DC | PRN
  Administered 2018-02-15: 2 g via INTRAVENOUS

## 2018-02-15 MED ORDER — PHENYLEPHRINE 40 MCG/ML (10ML) SYRINGE FOR IV PUSH (FOR BLOOD PRESSURE SUPPORT)
PREFILLED_SYRINGE | INTRAVENOUS | Status: AC
  Filled 2018-02-15: qty 20

## 2018-02-15 MED ORDER — LACTATED RINGERS IV SOLN
INTRAVENOUS | Status: DC | PRN
  Administered 2018-02-15 (×2): via INTRAVENOUS

## 2018-02-15 MED ORDER — ORAL CARE MOUTH RINSE
15.0000 mL | Freq: Two times a day (BID) | OROMUCOSAL | Status: DC
  Administered 2018-02-15 – 2018-02-18 (×4): 15 mL via OROMUCOSAL

## 2018-02-15 MED ORDER — DEXAMETHASONE SODIUM PHOSPHATE 10 MG/ML IJ SOLN
INTRAMUSCULAR | Status: AC
  Filled 2018-02-15: qty 1

## 2018-02-15 MED ORDER — SUFENTANIL CITRATE 50 MCG/ML IV SOLN
INTRAVENOUS | Status: AC
  Filled 2018-02-15: qty 1

## 2018-02-15 MED ORDER — MIDAZOLAM HCL 2 MG/2ML IJ SOLN
INTRAMUSCULAR | Status: AC
  Filled 2018-02-15: qty 2

## 2018-02-15 MED ORDER — POTASSIUM CHLORIDE 10 MEQ/50ML IV SOLN: 10.0000 meq | Freq: Every day | INTRAVENOUS | Status: DC | PRN

## 2018-02-15 MED ORDER — FENTANYL CITRATE (PF) 100 MCG/2ML IJ SOLN
100.0000 ug | Freq: Once | INTRAMUSCULAR | Status: AC
  Administered 2018-02-15: 50 ug via INTRAVENOUS
  Filled 2018-02-15: qty 2

## 2018-02-15 MED ORDER — ONDANSETRON HCL 4 MG/2ML IJ SOLN: 4.0000 mg | Freq: Four times a day (QID) | INTRAMUSCULAR | Status: DC | PRN

## 2018-02-15 MED ORDER — BISACODYL 5 MG PO TBEC
10.0000 mg | DELAYED_RELEASE_TABLET | Freq: Every day | ORAL | Status: DC
  Administered 2018-02-16 – 2018-02-18 (×2): 10 mg via ORAL
  Filled 2018-02-15 (×3): qty 2

## 2018-02-15 MED ORDER — SENNOSIDES-DOCUSATE SODIUM 8.6-50 MG PO TABS
1.0000 | ORAL_TABLET | Freq: Every day | ORAL | Status: DC
  Administered 2018-02-16 – 2018-02-17 (×2): 1 via ORAL
  Filled 2018-02-15 (×3): qty 1

## 2018-02-15 MED ORDER — ONDANSETRON HCL 4 MG/2ML IJ SOLN
4.0000 mg | Freq: Four times a day (QID) | INTRAMUSCULAR | Status: DC | PRN
  Administered 2018-02-17: 4 mg via INTRAVENOUS
  Filled 2018-02-15: qty 2

## 2018-02-15 MED ORDER — HYDROMORPHONE HCL 2 MG/ML IJ SOLN
INTRAMUSCULAR | Status: AC
  Filled 2018-02-15: qty 1

## 2018-02-15 MED ORDER — OXYCODONE HCL 5 MG PO TABS: 5.0000 mg | ORAL_TABLET | ORAL | Status: DC | PRN

## 2018-02-15 MED ORDER — LACTATED RINGERS IV SOLN
INTRAVENOUS | Status: DC | PRN
  Administered 2018-02-15: 10:00:00 via INTRAVENOUS

## 2018-02-15 MED ORDER — HEMOSTATIC AGENTS (NO CHARGE) OPTIME
TOPICAL | Status: DC | PRN
  Administered 2018-02-15: 1 via TOPICAL

## 2018-02-15 MED ORDER — MOMETASONE FURO-FORMOTEROL FUM 100-5 MCG/ACT IN AERO
2.0000 | INHALATION_SPRAY | Freq: Two times a day (BID) | RESPIRATORY_TRACT | Status: DC
  Administered 2018-02-15 – 2018-02-19 (×8): 2 via RESPIRATORY_TRACT
  Filled 2018-02-15: qty 8.8

## 2018-02-15 MED ORDER — MIDAZOLAM HCL 5 MG/5ML IJ SOLN
INTRAMUSCULAR | Status: DC | PRN
  Administered 2018-02-15: 2 mg via INTRAVENOUS

## 2018-02-15 MED ORDER — SODIUM CHLORIDE 0.9% FLUSH: 9.0000 mL | INTRAVENOUS | Status: DC | PRN

## 2018-02-15 MED ORDER — DIPHENHYDRAMINE HCL 12.5 MG/5ML PO ELIX
12.5000 mg | ORAL_SOLUTION | Freq: Four times a day (QID) | ORAL | Status: DC | PRN
  Filled 2018-02-15: qty 5

## 2018-02-15 MED ORDER — ONDANSETRON HCL 4 MG/2ML IJ SOLN: 4.0000 mg | Freq: Once | INTRAMUSCULAR | Status: DC | PRN

## 2018-02-15 MED ORDER — TRAMADOL HCL 50 MG PO TABS: 50.0000 mg | ORAL_TABLET | Freq: Four times a day (QID) | ORAL | Status: DC | PRN

## 2018-02-15 MED ORDER — ONDANSETRON HCL 4 MG/2ML IJ SOLN
INTRAMUSCULAR | Status: AC
  Filled 2018-02-15: qty 2

## 2018-02-15 MED ORDER — CEFAZOLIN SODIUM-DEXTROSE 2-4 GM/100ML-% IV SOLN
2.0000 g | Freq: Three times a day (TID) | INTRAVENOUS | Status: AC
  Administered 2018-02-15 – 2018-02-16 (×2): 2 g via INTRAVENOUS
  Filled 2018-02-15 (×2): qty 100

## 2018-02-15 MED ORDER — ACETAMINOPHEN 160 MG/5ML PO SOLN: 1000.0000 mg | Freq: Four times a day (QID) | ORAL | Status: DC

## 2018-02-15 SURGICAL SUPPLY — 92 items
APPLIER CLIP ROT 10 11.4 M/L (STAPLE)
CANISTER SUCT 3000ML PPV (MISCELLANEOUS) ×2 IMPLANT
CATH THORACIC 28FR (CATHETERS) IMPLANT
CATH THORACIC 28FR RT ANG (CATHETERS) IMPLANT
CATH THORACIC 36FR (CATHETERS) IMPLANT
CATH THORACIC 36FR RT ANG (CATHETERS) IMPLANT
CLIP APPLIE ROT 10 11.4 M/L (STAPLE) IMPLANT
CLIP VESOCCLUDE MED 6/CT (CLIP) ×2 IMPLANT
CONN ST 1/4X3/8  BEN (MISCELLANEOUS)
CONN ST 1/4X3/8 BEN (MISCELLANEOUS) IMPLANT
CONN Y 3/8X3/8X3/8  BEN (MISCELLANEOUS)
CONN Y 3/8X3/8X3/8 BEN (MISCELLANEOUS) IMPLANT
CONT SPEC 4OZ CLIKSEAL STRL BL (MISCELLANEOUS) ×20 IMPLANT
COVER SURGICAL LIGHT HANDLE (MISCELLANEOUS) IMPLANT
CUTTER ECHEON FLEX ENDO 45 340 (ENDOMECHANICALS) ×2 IMPLANT
DERMABOND ADVANCED (GAUZE/BANDAGES/DRESSINGS) ×1
DERMABOND ADVANCED .7 DNX12 (GAUZE/BANDAGES/DRESSINGS) ×1 IMPLANT
DRAIN CHANNEL 28F RND 3/8 FF (WOUND CARE) IMPLANT
DRAIN CHANNEL 32F RND 10.7 FF (WOUND CARE) IMPLANT
DRAPE LAPAROSCOPIC ABDOMINAL (DRAPES) ×2 IMPLANT
DRAPE SLUSH/WARMER DISC (DRAPES) ×2 IMPLANT
DRAPE WARM FLUID 44X44 (DRAPE) IMPLANT
ELECT BLADE 6.5 EXT (BLADE) ×2 IMPLANT
ELECT REM PT RETURN 9FT ADLT (ELECTROSURGICAL) ×2
ELECTRODE REM PT RTRN 9FT ADLT (ELECTROSURGICAL) ×1 IMPLANT
GAUZE SPONGE 4X4 12PLY STRL (GAUZE/BANDAGES/DRESSINGS) ×2 IMPLANT
GAUZE SPONGE 4X4 12PLY STRL LF (GAUZE/BANDAGES/DRESSINGS) ×2 IMPLANT
GLOVE BIO SURGEON STRL SZ 6.5 (GLOVE) ×8 IMPLANT
GLOVE BIOGEL PI IND STRL 6 (GLOVE) ×2 IMPLANT
GLOVE BIOGEL PI IND STRL 6.5 (GLOVE) ×4 IMPLANT
GLOVE BIOGEL PI INDICATOR 6 (GLOVE) ×2
GLOVE BIOGEL PI INDICATOR 6.5 (GLOVE) ×4
GLOVE SURG SIGNA 7.5 PF LTX (GLOVE) ×6 IMPLANT
GOWN STRL REUS W/ TWL LRG LVL3 (GOWN DISPOSABLE) ×4 IMPLANT
GOWN STRL REUS W/ TWL XL LVL3 (GOWN DISPOSABLE) ×1 IMPLANT
GOWN STRL REUS W/TWL LRG LVL3 (GOWN DISPOSABLE) ×4
GOWN STRL REUS W/TWL XL LVL3 (GOWN DISPOSABLE) ×1
HEMOSTAT SURGICEL 2X14 (HEMOSTASIS) ×2 IMPLANT
KIT BASIN OR (CUSTOM PROCEDURE TRAY) ×2 IMPLANT
KIT SUCTION CATH 14FR (SUCTIONS) IMPLANT
KIT TURNOVER KIT B (KITS) ×2 IMPLANT
NEEDLE HYPO 25GX1X1/2 BEV (NEEDLE) IMPLANT
NEEDLE SPNL 18GX3.5 QUINCKE PK (NEEDLE) IMPLANT
NEEDLE SPNL 22GX3.5 QUINCKE BK (NEEDLE) ×2 IMPLANT
NS IRRIG 1000ML POUR BTL (IV SOLUTION) ×4 IMPLANT
PACK CHEST (CUSTOM PROCEDURE TRAY) ×2 IMPLANT
PAD ARMBOARD 7.5X6 YLW CONV (MISCELLANEOUS) ×4 IMPLANT
POUCH ENDO CATCH II 15MM (MISCELLANEOUS) IMPLANT
POUCH SPECIMEN RETRIEVAL 10MM (ENDOMECHANICALS) IMPLANT
SCISSORS ENDO CVD 5DCS (MISCELLANEOUS) IMPLANT
SEALANT PROGEL (MISCELLANEOUS) IMPLANT
SEALANT SURG COSEAL 4ML (VASCULAR PRODUCTS) IMPLANT
SEALANT SURG COSEAL 8ML (VASCULAR PRODUCTS) IMPLANT
SHEARS HARMONIC HDI 20CM (ELECTROSURGICAL) ×2 IMPLANT
SOLUTION ANTI FOG 6CC (MISCELLANEOUS) ×2 IMPLANT
SPECIMEN JAR MEDIUM (MISCELLANEOUS) IMPLANT
SPONGE INTESTINAL PEANUT (DISPOSABLE) ×4 IMPLANT
SPONGE TONSIL 1 RF SGL (DISPOSABLE) ×2 IMPLANT
STAPLE RELOAD 2.5MM WHITE (STAPLE) ×10 IMPLANT
STAPLE RELOAD 45 GRN (STAPLE) ×1 IMPLANT
STAPLE RELOAD 45MM GOLD (STAPLE) ×16 IMPLANT
STAPLE RELOAD 45MM GREEN (STAPLE) ×1
STAPLER VASCULAR ECHELON 35 (CUTTER) ×2 IMPLANT
SUT PROLENE 4 0 RB 1 (SUTURE)
SUT PROLENE 4-0 RB1 .5 CRCL 36 (SUTURE) IMPLANT
SUT SILK  1 MH (SUTURE) ×2
SUT SILK 1 MH (SUTURE) ×2 IMPLANT
SUT SILK 1 TIES 10X30 (SUTURE) ×2 IMPLANT
SUT SILK 2 0 SH (SUTURE) ×2 IMPLANT
SUT SILK 2 0SH CR/8 30 (SUTURE) IMPLANT
SUT SILK 3 0 SH 30 (SUTURE) IMPLANT
SUT SILK 3 0SH CR/8 30 (SUTURE) ×2 IMPLANT
SUT VIC AB 1 CTX 36 (SUTURE) ×1
SUT VIC AB 1 CTX36XBRD ANBCTR (SUTURE) ×1 IMPLANT
SUT VIC AB 2-0 CT1 27 (SUTURE) ×1
SUT VIC AB 2-0 CT1 TAPERPNT 27 (SUTURE) ×1 IMPLANT
SUT VIC AB 2-0 CTX 36 (SUTURE) ×2 IMPLANT
SUT VIC AB 2-0 UR6 27 (SUTURE) ×2 IMPLANT
SUT VIC AB 3-0 MH 27 (SUTURE) IMPLANT
SUT VIC AB 3-0 X1 27 (SUTURE) ×4 IMPLANT
SUT VICRYL 2 TP 1 (SUTURE) IMPLANT
SYR 10ML LL (SYRINGE) IMPLANT
SYR 30ML LL (SYRINGE) ×4 IMPLANT
SYSTEM SAHARA CHEST DRAIN ATS (WOUND CARE) ×2 IMPLANT
TAPE CLOTH 4X10 WHT NS (GAUZE/BANDAGES/DRESSINGS) ×2 IMPLANT
TAPE CLOTH SURG 4X10 WHT LF (GAUZE/BANDAGES/DRESSINGS) ×2 IMPLANT
TIP APPLICATOR SPRAY EXTEND 16 (VASCULAR PRODUCTS) IMPLANT
TOWEL GREEN STERILE (TOWEL DISPOSABLE) ×4 IMPLANT
TOWEL GREEN STERILE FF (TOWEL DISPOSABLE) IMPLANT
TRAY FOLEY MTR SLVR 16FR STAT (SET/KITS/TRAYS/PACK) ×2 IMPLANT
TROCAR XCEL BLADELESS 5X75MML (TROCAR) ×2 IMPLANT
WATER STERILE IRR 1000ML POUR (IV SOLUTION) ×4 IMPLANT

## 2018-02-15 NOTE — Brief Op Note (Addendum)
02/15/2018  2:04 PM  PATIENT:  Aimee Lewis  57 y.o. female  PRE-OPERATIVE DIAGNOSIS:  NON_SMAL CELL CARCINOMA LUL- Clinical stage IB (T2N0)  POST-OPERATIVE DIAGNOSIS:  NONSMALL CELL  CARCINOMA LUL- Clinical stage IB (T2N0)  PROCEDURE:  Procedure(s):  LEFT VIDEO ASSISTED THORACOSCOPY  -Left Upper Lobectomy -Lymph Node Sampling -Intercostal Nerve Block   SURGEON:  Surgeon(s) and Role:    * Melrose Nakayama, MD - Primary  PHYSICIAN ASSISTANT: Erin Barrett PA-C,   ANESTHESIA:   general  EBL:  50 mL   BLOOD ADMINISTERED:none  DRAINS: Left pleural chest tubes   LOCAL MEDICATIONS USED:  BUPIVICAINE   SPECIMEN:  Source of Specimen:  Left Upper Lobe, Lymph Nodes  DISPOSITION OF SPECIMEN:  PATHOLOGY  COUNTS:  YES  TOURNIQUET:  * No tourniquets in log *  DICTATION: .Dragon Dictation  PLAN OF CARE: Admit to inpatient   PATIENT DISPOSITION:  PACU - hemodynamically stable.   Delay start of Pharmacological VTE agent (>24hrs) due to surgical blood loss or risk of bleeding: no  Bronchial margin- no tumor seen

## 2018-02-15 NOTE — Anesthesia Procedure Notes (Signed)
Arterial Line Insertion Start/End5/15/2019 11:15 AM, 02/15/2018 11:25 AM Performed by: Kyung Rudd, CRNA, CRNA  Patient location: Pre-op. Preanesthetic checklist: patient identified, IV checked, site marked, risks and benefits discussed, surgical consent, monitors and equipment checked, pre-op evaluation and timeout performed Lidocaine 1% used for infiltration and patient sedated Left, radial was placed Catheter size: 20 G Hand hygiene performed , maximum sterile barriers used  and Seldinger technique used Allen's test indicative of satisfactory collateral circulation Attempts: 1 Procedure performed without using ultrasound guided technique. Following insertion, Biopatch and dressing applied. Post procedure assessment: normal  Patient tolerated the procedure well with no immediate complications.

## 2018-02-15 NOTE — Anesthesia Postprocedure Evaluation (Signed)
Anesthesia Post Note  Patient: Aimee Lewis  Procedure(s) Performed: VIDEO ASSISTED THORACOSCOPY (VATS)/LEFT UPPER LOBECTOMY (Left Chest)     Patient location during evaluation: PACU Anesthesia Type: General Level of consciousness: awake and alert Pain management: pain level controlled Vital Signs Assessment: post-procedure vital signs reviewed and stable Respiratory status: spontaneous breathing, nonlabored ventilation, respiratory function stable and patient connected to nasal cannula oxygen Cardiovascular status: blood pressure returned to baseline and stable Postop Assessment: no apparent nausea or vomiting Anesthetic complications: no    Last Vitals:  Vitals:   02/15/18 1615 02/15/18 1625  BP: 129/86   Pulse: 85 83  Resp: 14 13  Temp:    SpO2: 96% 90%    Last Pain:  Vitals:   02/15/18 1625  TempSrc:   PainSc: 7                  Alegandro Macnaughton DAVID

## 2018-02-15 NOTE — Progress Notes (Addendum)
Patient arrived from PACU with moderate air leak; RN auscultated pleural rub/rhonchi breath sounds L side. O2 sats 100% on 2L, no respiratory distress noted. MD paged, chest tube dressing changed. RN used vaseline gauze as well as gauze/tape dressing. Air leak subsided following dressing change.  No abnormalities noted around site. Will monitor closely.

## 2018-02-15 NOTE — Anesthesia Preprocedure Evaluation (Signed)
Anesthesia Evaluation  Patient identified by MRN, date of birth, ID band Patient awake    Reviewed: Allergy & Precautions, NPO status , Patient's Chart, lab work & pertinent test results  Airway Mallampati: I  TM Distance: >3 FB Neck ROM: Full    Dental   Pulmonary Current Smoker,    Pulmonary exam normal        Cardiovascular hypertension, Pt. on medications Normal cardiovascular exam     Neuro/Psych    GI/Hepatic   Endo/Other    Renal/GU      Musculoskeletal   Abdominal   Peds  Hematology   Anesthesia Other Findings   Reproductive/Obstetrics                             Anesthesia Physical Anesthesia Plan  ASA: II  Anesthesia Plan: General   Post-op Pain Management:    Induction: Intravenous  PONV Risk Score and Plan: 2  Airway Management Planned: Double Lumen EBT and Video Laryngoscope Planned  Additional Equipment: Arterial line, CVP and Ultrasound Guidance Line Placement  Intra-op Plan:   Post-operative Plan: Extubation in OR  Informed Consent: I have reviewed the patients History and Physical, chart, labs and discussed the procedure including the risks, benefits and alternatives for the proposed anesthesia with the patient or authorized representative who has indicated his/her understanding and acceptance.     Plan Discussed with: CRNA and Surgeon  Anesthesia Plan Comments:         Anesthesia Quick Evaluation

## 2018-02-15 NOTE — Interval H&P Note (Signed)
History and Physical Interval Note:  02/15/2018 10:53 AM  Aimee Lewis  has presented today for surgery, with the diagnosis of NONSMALL CELL  CARCINOMA LUL  The various methods of treatment have been discussed with the patient and family. After consideration of risks, benefits and other options for treatment, the patient has consented to  Procedure(s): VIDEO ASSISTED THORACOSCOPY (VATS)/LEFT UPPER LOBECTOMY (Left) as a surgical intervention .  The patient's history has been reviewed, patient examined, no change in status, stable for surgery.  I have reviewed the patient's chart and labs.  Questions were answered to the patient's satisfaction.     Melrose Nakayama

## 2018-02-15 NOTE — Anesthesia Procedure Notes (Signed)
Procedure Name: Intubation Date/Time: 02/15/2018 11:57 AM Performed by: Moshe Salisbury, CRNA Pre-anesthesia Checklist: Patient identified, Emergency Drugs available, Suction available and Patient being monitored Patient Re-evaluated:Patient Re-evaluated prior to induction Oxygen Delivery Method: Circle System Utilized Preoxygenation: Pre-oxygenation with 100% oxygen Induction Type: IV induction Ventilation: Mask ventilation without difficulty Laryngoscope Size: Mac and 3 Grade View: Grade I Endobronchial tube: Left, Double lumen EBT and EBT position confirmed by auscultation and 35 Fr Number of attempts: 1 Airway Equipment and Method: Stylet Placement Confirmation: ETT inserted through vocal cords under direct vision,  positive ETCO2 and breath sounds checked- equal and bilateral Secured at: 35 cm Tube secured with: Tape Dental Injury: Teeth and Oropharynx as per pre-operative assessment

## 2018-02-15 NOTE — Transfer of Care (Signed)
Immediate Anesthesia Transfer of Care Note  Patient: Aimee Lewis  Procedure(s) Performed: VIDEO ASSISTED THORACOSCOPY (VATS)/LEFT UPPER LOBECTOMY (Left Chest)  Patient Location: PACU  Anesthesia Type:General  Level of Consciousness: awake, oriented and patient cooperative  Airway & Oxygen Therapy: Patient Spontanous Breathing and Patient connected to nasal cannula oxygen  Post-op Assessment: Report given to RN, Post -op Vital signs reviewed and stable and Patient moving all extremities  Post vital signs: Reviewed and stable  Last Vitals:  Vitals Value Taken Time  BP 116/96 02/15/2018  4:03 PM  Temp    Pulse 87 02/15/2018  4:05 PM  Resp 24 02/15/2018  4:05 PM  SpO2 100 % 02/15/2018  4:05 PM  Vitals shown include unvalidated device data.  Last Pain:  Vitals:   02/15/18 0949  TempSrc:   PainSc: 0-No pain      Patients Stated Pain Goal: 2 (99/83/38 2505)  Complications: No apparent anesthesia complications

## 2018-02-15 NOTE — Anesthesia Procedure Notes (Signed)
Central Venous Catheter Insertion Performed by: Lillia Abed, MD, anesthesiologist Start/End5/15/2019 10:50 AM, 02/15/2018 11:00 AM Patient location: Pre-op. Preanesthetic checklist: patient identified, IV checked, risks and benefits discussed, surgical consent, monitors and equipment checked, pre-op evaluation, timeout performed and anesthesia consent Lidocaine 1% used for infiltration and patient sedated Hand hygiene performed  and maximum sterile barriers used  Catheter size: 8 Fr Total catheter length 16. Central line was placed.Double lumen Procedure performed using ultrasound guided technique. Ultrasound Notes:image(s) printed for medical record Attempts: 1 Following insertion, dressing applied, line sutured and Biopatch. Post procedure assessment: blood return through all ports, free fluid flow and no air  Patient tolerated the procedure well with no immediate complications.

## 2018-02-16 ENCOUNTER — Inpatient Hospital Stay (HOSPITAL_COMMUNITY): Payer: BLUE CROSS/BLUE SHIELD

## 2018-02-16 ENCOUNTER — Encounter (HOSPITAL_COMMUNITY): Payer: Self-pay | Admitting: Thoracic Surgery (Cardiothoracic Vascular Surgery)

## 2018-02-16 LAB — BASIC METABOLIC PANEL
Anion gap: 4 — ABNORMAL LOW (ref 5–15)
BUN: 11 mg/dL (ref 6–20)
CO2: 19 mmol/L — ABNORMAL LOW (ref 22–32)
Calcium: 5.6 mg/dL — CL (ref 8.9–10.3)
Chloride: 119 mmol/L — ABNORMAL HIGH (ref 101–111)
Creatinine, Ser: 0.58 mg/dL (ref 0.44–1.00)
GFR calc Af Amer: >60 mL/min (ref >60)
GFR calc non Af Amer: >60 mL/min (ref >60)
Glucose, Bld: 123 mg/dL — ABNORMAL HIGH (ref 65–99)
Potassium: 3.2 mmol/L — ABNORMAL LOW (ref 3.5–5.1)
Sodium: 142 mmol/L (ref 135–145)

## 2018-02-16 LAB — COMPREHENSIVE METABOLIC PANEL
ALT: 11 U/L — ABNORMAL LOW (ref 14–54)
AST: 22 U/L (ref 15–41)
Albumin: 3.1 g/dL — ABNORMAL LOW (ref 3.5–5.0)
Alkaline Phosphatase: 37 U/L — ABNORMAL LOW (ref 38–126)
Anion gap: 7 (ref 5–15)
BUN: 12 mg/dL (ref 6–20)
CO2: 26 mmol/L (ref 22–32)
Calcium: 7.7 mg/dL — ABNORMAL LOW (ref 8.9–10.3)
Chloride: 104 mmol/L (ref 101–111)
Creatinine, Ser: 0.81 mg/dL (ref 0.44–1.00)
GFR calc Af Amer: >60 mL/min (ref >60)
GFR calc non Af Amer: >60 mL/min (ref >60)
Glucose, Bld: 126 mg/dL — ABNORMAL HIGH (ref 65–99)
Potassium: 4.3 mmol/L (ref 3.5–5.1)
Sodium: 137 mmol/L (ref 135–145)
Total Bilirubin: 0.5 mg/dL (ref 0.3–1.2)
Total Protein: 5.1 g/dL — ABNORMAL LOW (ref 6.5–8.1)

## 2018-02-16 LAB — CBC
HCT: 30.9 % — ABNORMAL LOW (ref 36.0–46.0)
HCT: 30.8 % — ABNORMAL LOW (ref 36.0–46.0)
Hemoglobin: 10 g/dL — ABNORMAL LOW (ref 12.0–15.0)
Hemoglobin: 9.8 g/dL — ABNORMAL LOW (ref 12.0–15.0)
MCH: 32.3 pg (ref 26.0–34.0)
MCH: 32 pg (ref 26.0–34.0)
MCHC: 32.4 g/dL (ref 30.0–36.0)
MCHC: 31.8 g/dL (ref 30.0–36.0)
MCV: 99.7 fL (ref 78.0–100.0)
MCV: 100.7 fL — ABNORMAL HIGH (ref 78.0–100.0)
Platelets: 189 10*3/uL (ref 150–400)
Platelets: 198 10*3/uL (ref 150–400)
RBC: 3.1 MIL/uL — ABNORMAL LOW (ref 3.87–5.11)
RBC: 3.06 MIL/uL — ABNORMAL LOW (ref 3.87–5.11)
RDW: 12.8 % (ref 11.5–15.5)
RDW: 13 % (ref 11.5–15.5)
WBC: 6.7 10*3/uL (ref 4.0–10.5)
WBC: 6.4 10*3/uL (ref 4.0–10.5)

## 2018-02-16 LAB — BLOOD GAS, ARTERIAL
Acid-base deficit: 3.7 mmol/L — ABNORMAL HIGH (ref 0.0–2.0)
Bicarbonate: 21.5 mmol/L (ref 20.0–28.0)
Drawn by: 270271
O2 Content: 2 L/min
O2 Saturation: 99 %
Patient temperature: 98.7
pCO2 arterial: 43.8 mmHg (ref 32.0–48.0)
pH, Arterial: 7.312 — ABNORMAL LOW (ref 7.350–7.450)
pO2, Arterial: 149 mmHg — ABNORMAL HIGH (ref 83.0–108.0)

## 2018-02-16 MED ORDER — ALBUTEROL SULFATE (2.5 MG/3ML) 0.083% IN NEBU
2.5000 mg | INHALATION_SOLUTION | Freq: Three times a day (TID) | RESPIRATORY_TRACT | Status: DC
  Administered 2018-02-16 – 2018-02-19 (×9): 2.5 mg via RESPIRATORY_TRACT
  Filled 2018-02-16 (×9): qty 3

## 2018-02-16 MED ORDER — SODIUM CHLORIDE 0.45 % IV SOLN
INTRAVENOUS | Status: DC
  Administered 2018-02-16 – 2018-02-17 (×2): via INTRAVENOUS

## 2018-02-16 NOTE — Op Note (Signed)
NAME: Aimee Lewis, Aimee Lewis MEDICAL RECORD OI:[id number] ACCOUNT [id number] DATE OF BIRTH:25-Sep-1961 FACILITY: [facility] LOCATION: [facility] PHYSICIAN:STEVEN Chaya Jan, MD  OPERATIVE REPORT  DATE OF PROCEDURE:  02/15/2018  PREOPERATIVE DIAGNOSIS:  Nonsmall cell carcinoma, left upper lobe, clinical stage IB (T2 N0).  POSTOPERATIVE DIAGNOSIS:  Nonsmall cell carcinoma, left upper lobe, clinical stage IB (T2 N0).  PROCEDURE PERFORMED:   Left video-assisted thoracoscopy, Thoracoscopic left upper lobectomy, Mediastinal lymph node sampling, Intercostal nerve block.  SURGEON:  Modesto Charon, MD  ASSISTANT:  Ellwood Handler, PA  ANESTHESIA:  General.  FINDINGS:  Mass in the left upper lobe. Relative paucity of lymph nodes. Fibrotic tissue around the lymph nodes.  Bronchial margin negative for tumor.  Adhesions of lower lobe to diaphragm.  CLINICAL NOTE:  The patient is a 57 year old woman with a history of tobacco abuse and COPD.  She developed a productive cough in December of 2018.  She was treated for pneumonia and her symptoms ultimately resolved.  An abnormal chest x-ray led to a CT  scan which showed a 4.6 x 3.9 x 1.9 cm left upper lobe mass.  A followup CT showed the mass was unchanged.  It was hypermetabolic on PET.  Dr. Dayle Points did navigational bronchoscopy and brushings were positive for nonsmall cell carcinoma.  She was  advised to undergo left upper lobectomy.  She was offered the option of radiation as an alternative.  The relative advantages and disadvantages of each approach were discussed in detail with the patient.  She understood and accepted the risks of surgery  and wished to proceed.  DESCRIPTION OF PROCEDURE:  The patient was brought to the preoperative holding area on 02/15/2018. Anesthesia placed an arterial blood pressure monitoring line and a central venous line.  She was taken to the operating room, anesthetized and intubated  with a double lumen endotracheal  tube.  Intravenous antibiotics were administered.  A Foley catheter was placed.  Sequential compression devices were placed on the calves for DVT prophylaxis.  She was placed in a right lateral decubitus position.  A Bair  Hugger was placed to maintain temperature.  The left chest was prepped and draped in the usual sterile fashion.  Single lung ventilation of the right lung was initiated and was tolerated well throughout the procedure.  A solution of 20 mL of liposomal bupivacaine mixed with 50 mL of normal saline was used for long-acting local anesthetic at the site for insertion of the scope.  An incision was made.  The 5 mm port was inserted into the chest.  There was good isolation  of the left lung.  The 4th interspace was selected for the working incision.  This area was anesthetized with the liposomal bupivacaine solution.  An incision was then made.  No rib spreading was performed during the procedure.  The liposomal bupivacaine  solution then was used for intercostal nerve blocks by applying 5 mL of the solution into each interspace from the 3rd through the 9th.  The liposomal bupivacaine solution was injected into the subpleural space.  Inspection of the chest revealed no pleural effusion.  There was no abnormality of the visceral or parietal pleura.  The fissures were relatively complete.  There were adhesions of the lower lobe to the diaphragm.  The pleural reflection was divided at  the hilum anteriorly.  The initial portion of the major fissure between the lingula and the lower lobe was divided with an Echelon powered stapler.  A 45 mm stapler with a gold  cartridge was used.  This allowed better visualization of the superior  pulmonary vein.  The lingular branches and the remaining branches were dissected out separately.  The vein was divided with the endoscopic vascular stapler.  Dissection then returned back to the fissure.  The fissure was relatively complete.  The pleura  was incised and  the pulmonary artery was identified.  Once the basilar pulmonary artery was dissected out, the remainder of the major fissure anteriorly was completed with another firing of the Echelon stapler.  Dissection then was carried along the main  pulmonary artery from anterior to posterior.  The fissure was incomplete between the superior segment and the upper lobe and this was completed with sequential firings of the Echelon stapler.  The posterior pleural reflection was opened with the  Harmonic scalpel prior to completing that fissure.  Level 7 node was removed.  All lymph nodes that were encountered were sent as separate specimens for permanent pathology.  The lingular arterial branch arose as a common trunk.  This was dissected out,  encircled and divided with the endoscopic vascular stapler.  To achieve the appropriate angle, a second port incision was made anterior to the first and the stapler was placed through this port incision.    The dissection then was carried superiorly and the remainder of the pleural reflection was divided superiorly.  Posteriorly and superiorly this was relatively thick tissue and was divided with the Harmonic scalpel.  A posterior branch was dissected out  from the fissure side, encircled and divided with the endoscopic vascular stapler.  The anterior apical branch was large, but ultimately was dissected out and divided with the endoscopic vascular stapler as well.  Bronchial nodes were removed and sent as  separate specimens.  The Echelon stapler with a green cartridge then was placed across the left upper lobe bronchus and closed.  A test inflation showed good aeration of the lower lobe.  The stapler was fired, transecting the bronchus.  The left upper  lobe was placed into an endoscopic retrieval bag, removed and sent for frozen section of the bronchial margin, which returned with no tumor seen.  The adhesions between the lower lobe and the diaphragm were taken down.  The  inferior ligament was divided.   The chest was copiously irrigated with warm saline.  There was good hemostasis at all the staple lines.  Test inflation at 30 cm of water revealed minimal leakage from the parenchyma.  A 28-French chest tube was placed through the original port  incision and secured with a #1 silk suture.  The remaining port incision was closed with a 3-0 Vicryl subcuticular suture.  The lower lobe was reinflated.  The working incision was closed in 3 layers in standard fashion.  The chest tube was placed to  suction.  The patient was placed back in the supine position.  She was extubated in the operating room and taken to the [facility] in good condition.  GN/NUANCE  D:02/15/2018 T:02/16/2018 JOB:[id number]

## 2018-02-16 NOTE — Care Management Note (Signed)
Case Management Note  Patient Details  Name: Aimee Lewis MRN: [id number] Date of Birth: 06-29-1961  Subjective/Objective:     Pt is s/p VATS               Action/Plan:   PTA independent from home with husband.  Pt has PCP.  CM will continue to follow for discharge needs   Expected Discharge Date:                  Expected Discharge Plan:  Home/Self Care  In-House Referral:     Discharge planning Services  CM Consult  Post Acute Care Choice:    Choice offered to:     DME Arranged:    DME Agency:     HH Arranged:    HH Agency:     Status of Service:     If discussed at [employer] of Stay Meetings, dates discussed:    Additional Comments:  Maryclare Labrador, RN 02/16/2018, 3:07 PM

## 2018-02-16 NOTE — Progress Notes (Addendum)
      [address]       [place] [address]             [phone]      1 Day Post-Op Procedure(s) (LRB): VIDEO ASSISTED THORACOSCOPY (VATS)/LEFT UPPER LOBECTOMY (Left)   Subjective:  Patient doing okay.  Pain is well controlled.  Denies N/V... Tolerating liquids overnight.  Objective: Vital signs in last 24 hours: Temp:  [97.3 F (36.3 C)-98.3 F (36.8 C)] 97.5 F (36.4 C) (05/15 1944) Pulse Rate:  [63-94] 76 (05/16 0737) Cardiac Rhythm: Normal sinus rhythm (05/16 0737) Resp:  [9-23] 23 (05/16 0737) BP: (113-144)/(71-96) 113/71 (05/16 0737) SpO2:  [90 %-100 %] 100 % (05/16 0737) Arterial Line BP: (108-137)/(53-66) 133/58 (05/16 0737) Weight:  [124 lb 4.8 oz (56.4 kg)-126 lb 1.7 oz (57.2 kg)] 126 lb 1.7 oz (57.2 kg) (05/15 1700)  Intake/Output from previous day: 05/15 0701 - 05/16 0700 In: 2185 [P.O.:50; I.V.:1810; IV Piggyback:250] Out: 1005 [Urine:625; Blood:100; Chest Tube:280]  General appearance: alert, cooperative and no distress Heart: regular rate and rhythm Lungs: clear to auscultation bilaterally Abdomen: soft, non-tender; bowel sounds normal; no masses,  no organomegaly Extremities: extremities normal, atraumatic, no cyanosis or edema Wound: clean and dry  Lab Results: Recent Labs    02/13/18 0902 02/16/18 0621  WBC 5.3 6.7  HGB 13.3 10.0*  HCT 40.3 30.9*  PLT 283 189   BMET:  Recent Labs    02/13/18 0902 02/16/18 0410  NA 136 142  K 4.8 3.2*  CL 102 119*  CO2 23 19*  GLUCOSE 103* 123*  BUN 15 11  CREATININE 0.80 0.58  CALCIUM 9.7 5.6*    PT/INR:  Recent Labs    02/13/18 0902  LABPROT 12.5  INR 0.94   ABG    Component Value Date/Time   PHART 7.312 (L) 02/16/2018 0355   HCO3 21.5 02/16/2018 0355   ACIDBASEDEF 3.7 (H) 02/16/2018 0355   O2SAT 99.0 02/16/2018 0355   CBG (last 3)  No results for input(s): GLUCAP in the last 72 hours.  Assessment/Plan: S/P Procedure(s) (LRB): VIDEO ASSISTED THORACOSCOPY  (VATS)/LEFT UPPER LOBECTOMY (Left)  1. Chest tube- 1+ air leak with cough, output is low- leave on suction today 2. CV- NSR, H/O HTN- continue home antihypertensive agents 3. Pulm- no pneumothorax on CXR, small amount of sub q air... Patient with poor FEV1 preop, continue bronchodilators 4. D/C Arterial line 5. Decrease/change IV fluids to 1/2 NS at 50 ml/hr if tolerates diet will place to [facility] later today 6. Hypocalcemia- dropped from 9 to 5.6 today, will repeat lab if accurate will supplement 7. Hypokalemia- supplement per ICU protocol 8. Dispo- patient stable, on Lovenox for DVT prophylaxis, recheck lab levels, supplement calcium if accurate, supplement K per protocol, repeat CXR in AM   LOS: 1 day    Ellwood Handler 02/16/2018 Patient seen and examined, agree with above SCD + enoxaparin for DVT prophylaxis Mobilize  Cathern Tahir C. Roxan Hockey, MD [facility] [phone]

## 2018-02-17 ENCOUNTER — Inpatient Hospital Stay (HOSPITAL_COMMUNITY): Payer: BLUE CROSS/BLUE SHIELD

## 2018-02-17 LAB — BASIC METABOLIC PANEL
Anion gap: 7 (ref 5–15)
BUN: 8 mg/dL (ref 6–20)
CO2: 26 mmol/L (ref 22–32)
Calcium: 8.2 mg/dL — ABNORMAL LOW (ref 8.9–10.3)
Chloride: 101 mmol/L (ref 101–111)
Creatinine, Ser: 0.64 mg/dL (ref 0.44–1.00)
GFR calc Af Amer: >60 mL/min (ref >60)
GFR calc non Af Amer: >60 mL/min (ref >60)
Glucose, Bld: 111 mg/dL — ABNORMAL HIGH (ref 65–99)
Potassium: 3.6 mmol/L (ref 3.5–5.1)
Sodium: 134 mmol/L — ABNORMAL LOW (ref 135–145)

## 2018-02-17 LAB — CBC
HCT: 29.5 % — ABNORMAL LOW (ref 36.0–46.0)
Hemoglobin: 9.7 g/dL — ABNORMAL LOW (ref 12.0–15.0)
MCH: 33 pg (ref 26.0–34.0)
MCHC: 32.9 g/dL (ref 30.0–36.0)
MCV: 100.3 fL — ABNORMAL HIGH (ref 78.0–100.0)
Platelets: 187 10*3/uL (ref 150–400)
RBC: 2.94 MIL/uL — ABNORMAL LOW (ref 3.87–5.11)
RDW: 13.1 % (ref 11.5–15.5)
WBC: 6.3 10*3/uL (ref 4.0–10.5)

## 2018-02-17 LAB — FUNGUS CULTURE WITH STAIN

## 2018-02-17 LAB — FUNGUS CULTURE RESULT

## 2018-02-17 LAB — FUNGAL ORGANISM REFLEX

## 2018-02-17 MED ORDER — ENOXAPARIN SODIUM 40 MG/0.4ML ~~LOC~~ SOLN
40.0000 mg | SUBCUTANEOUS | Status: DC
  Administered 2018-02-17 – 2018-02-19 (×3): 40 mg via SUBCUTANEOUS
  Filled 2018-02-17 (×3): qty 0.4

## 2018-02-17 MED ORDER — POTASSIUM CHLORIDE CRYS ER 20 MEQ PO TBCR
20.0000 meq | EXTENDED_RELEASE_TABLET | Freq: Two times a day (BID) | ORAL | Status: AC
  Administered 2018-02-17 (×2): 20 meq via ORAL
  Filled 2018-02-17 (×2): qty 1

## 2018-02-17 NOTE — Progress Notes (Signed)
Patient encouraged by RN to move around in bed, ambulate to chair for dinner. RN educated patient on importance of ambulation and utilizing PCA to control pain, making ambulation possible. Patient refused twice. RN insisted patient rotate to one side- patient started shaking and screaming. Patient said "I can't do this" and rolled back on her own. RN let patient rest, said she would try again later. Will continue to monitor.  82 RN again attempted to ambulate patient, patient refused saying she would try again after family left. Patient did not want her family to see her in pain.  64 RN insisted patient get to side of bed and dangle feet- encouraged patient to use PCA and be mindful of chest tube. Patient was able to sit on the side of the bed for a couple minutes- HR in the 150s, patient stated she felt dizzy- RN was worried that patient would accidentally pull tube while adjusting. RN asked patient to stand at bedside, patient refused. Patient began crying and pulled herself back into bed, RN was mindful of chest tube. RN again told patient that she would need to increase activity, encouraged patient to use incentive spirometer in meantime. Patient reaching 650 on IS. Will inform night shift RN.

## 2018-02-17 NOTE — Discharge Summary (Addendum)
Physician Discharge Summary  Patient ID: Aimee Lewis MRN: [id number] DOB/AGE: Aug 13, 1961 57 y.o.  Admit date: 02/15/2018 Discharge date: 02/19/2018  Admission Diagnoses: Stage IB (T2N0) non-small cell carcinoma left upper lobe Patient Active Problem List   Diagnosis Date Noted  . Non-small cell lung cancer, left ([place]) 01/25/2018  . Abnormal CT of the chest 01/18/2018  Hypertension COPD  Discharge Diagnoses: Stage IB (T2N0) non-small cell carcinoma left upper lobe   Patient Active Problem List   Diagnosis Date Noted  . S/P lobectomy of lung 02/15/2018  . Non-small cell lung cancer, left ([place]) 01/25/2018  . Abnormal CT of the chest 01/18/2018   Discharged Condition: good  History of Present Illness:  Aimee Lewis is a 57 yo white female with H/O tobacco abuse, COPD, Hypertension, and Pneumonia.  She developed a chronic cough in December 2018.  She was diagnosed with pneumonia in January and was treated with a course of antibiotics.  The symptoms did resolve about 6 weeks after onset.  Follow up CXR was abnormal prompting further workup with CT scan performed in February.  This showed a 4.6 x 3.9 x 1.9 cm left upper lobe mass with solid and subsolid components.  Further workup with PET CT scan showed low level of hypermetabolism with no mediastinal or hilar adenopathy.  She underwent Navigational bronchoscopy and brushings which came back positive for non-small cell carcinoma.  Due to this she was subsequently referred to Dr. Roxan Hockey at [facility] for surgical resection.  At the time of evaluation the patient denied shortness of breath, wheezing, hemoptysis, and weight loss.  She did admit to having a cough.  She smokes 1ppd for 38 years.  It was felt she should undergo a VATs procedure with lobectomy for treatment.  The risks and benefits of the procedure were explained to the patient and she was agreeable to proceed.  Hospital Course:   Aimee Lewis presented to [facility]  on 02/15/2018.  She was taken the operating room and underwent Left Video Assisted Thoracoscopy, Left Upper Lobectomy, Lymph Node Sampling, and Intercostal Nerve Block.  She tolerated the procedure without difficulty, was extubated and taken to the PACU in stable condition.  She progressed without difficulty post operatively.  She was hypertensive and her medications were restarted.  Her chest tube initially had a small air leak.  CXR was free from pneumothorax.  Due to this her chest tube was left on suction for an additional 24 hours.  She was transitioned to water seal and her chest tube was later removed on 02/18/2018.  Follow up CXR showed slight increase in left apical pneumothorax. Left hemidiaphragm.  Her arterial, central line, and foley catheter were removed without difficulty.  She is ambulating with minimal assistance.  Her pain is well controlled.  She is tolerating a regular diet.  She is felt medically stable for discharge home today.  Significant Diagnostic Studies: nuclear medicine: PET CT  1. The previously demonstrated mixed solid and subsolid mass in the left upper lobe is similar in size to the prior examination and demonstrates low-level hypermetabolism. These findings are highly concerning for primary bronchogenic carcinoma such as an adenocarcinoma. Further evaluation with biopsy is strongly recommended in the near future. 2. No mediastinal or hilar lymphadenopathy, and no findings to suggest metastatic disease in the neck, chest, abdomen or pelvis. 3. Aortic atherosclerosis, in addition to left main and 3 vessel coronary artery disease. Please note that although the presence of coronary artery calcium documents the presence  of coronary artery disease, the severity of this disease and any potential stenosis cannot be assessed on this non-gated CT examination. Assessment for potential risk factor modification, dietary therapy or pharmacologic therapy may be warranted, if  clinically indicated. 4. There are calcifications of the aortic valve. Echocardiographic correlation for evaluation of potential valvular dysfunction may be warranted if clinically indicated. 5. Mild diffuse bronchial wall thickening with mild centrilobular and paraseptal emphysema; imaging findings suggestive of underlying COPD.  Treatments: surgery:    Left video-assisted thoracoscopy, thoracoscopic left upper lobectomy, mediastinal lymph node sampling, intercostal nerve block.  Discharge Exam: Blood pressure (!) 119/91, pulse (!) 106, temperature 98 F (36.7 C), temperature source Oral, resp. rate 17, height 5\' 2"  (1.575 m), weight 126 lb 1.7 oz (57.2 kg), SpO2 97 %.   General appearance: alert and cooperative Neurologic: intact Heart: regular rate and rhythm, S1, S2 normal, no murmur, click, rub or gallop Lungs: clear to auscultation bilaterally Abdomen: soft, non-tender; bowel sounds normal; no masses,  no organomegaly Extremities: extremities normal, atraumatic, no cyanosis or edema and Homans sign is negative, no sign of DVT Wound: Left chest wounds are intact    Disposition: Discharge disposition: 01-Home or Self Care     Home  Discharge Medications:  Discharge Instructions    Discharge patient   Complete by:  As directed    Discharge disposition:  01-Home or Self Care   Discharge patient date:  02/19/2018     Allergies as of 02/19/2018      Reactions   Codeine Nausea And Vomiting   Darvon [propoxyphene] Nausea And Vomiting      Medication List    STOP taking these medications   naproxen sodium 220 MG tablet Commonly known as:  ALEVE     TAKE these medications   acetaminophen 500 MG tablet Commonly known as:  TYLENOL Take 2 tablets (1,000 mg total) by mouth every 6 (six) hours.   fluticasone 50 MCG/ACT nasal spray Commonly known as:  FLONASE Place 1 spray into both nostrils daily as needed for allergies or rhinitis.   Fluticasone-Salmeterol 100-50  MCG/DOSE Aepb Commonly known as:  ADVAIR Inhale 1 puff into the lungs daily as needed (shortness of breath).   ibuprofen 200 MG tablet Commonly known as:  ADVIL,MOTRIN Take 400 mg by mouth daily as needed for headache or moderate pain.   lisinopril 40 MG tablet Commonly known as:  PRINIVIL,ZESTRIL Take 40 mg by mouth daily.   traMADol 50 MG tablet Commonly known as:  ULTRAM Take 1-2 tablets (50-100 mg total) by mouth every 6 (six) hours as needed (mild pain).      Follow-up Information    Melrose Nakayama, MD Follow up on 03/14/2018.   Specialty:  Cardiothoracic Surgery Why:  Appointment is at 2:30, please get CXR at 2:00 at [place] located on first floor of our office building Contact information: [place] [address] [phone]        Dineen Kid, MD. Call in 1 day(s).   Specialty:  Family Medicine Contact information: [place] [address] [phone]        nursing appointment Follow up.   Why:  call office for sutures removal appointment for Friday May 24th Contact information: Dr. Aundra Dubin office          Signed: Elgie Collard 02/19/2018, 10:18 AM

## 2018-02-17 NOTE — Discharge Instructions (Signed)
Discharge Instructions:  1. You may shower, please wash incisions daily with soap and water and keep dry.  If you wish to cover wounds with dressing you may do so but please keep clean and change daily.  No tub baths or swimming until incisions have completely healed.  If your incisions become red or develop any drainage please call our office at [phone]  2. No Driving until  you are no longer using narcotic pain medications  3. Fever of 101.5 for at least 24 hours with no source, please contact our office at [phone]  5. Activity- up as tolerated, please walk at least 3 times per day.  Avoid strenuous activity  6. If any questions or concerns arise, please do not hesitate to contact our office at [phone]

## 2018-02-17 NOTE — Progress Notes (Addendum)
2 Days Post-Op Procedure(s) (LRB): VIDEO ASSISTED THORACOSCOPY (VATS)/LEFT UPPER LOBECTOMY (Left) Subjective: C/o incisional pain  Objective: Vital signs in last 24 hours: Temp:  [98.1 F (36.7 C)-98.8 F (37.1 C)] 98.1 F (36.7 C) (05/17 0334) Pulse Rate:  [79-93] 79 (05/17 0334) Cardiac Rhythm: Normal sinus rhythm (05/17 0732) Resp:  [11-22] 19 (05/17 0732) BP: (106-116)/(60-83) 116/74 (05/17 0334) SpO2:  [97 %-100 %] 98 % (05/17 0732)  Hemodynamic parameters for last 24 hours:    Intake/Output from previous day: 05/16 0701 - 05/17 0700 In: 1596.7 [P.O.:560; I.V.:1036.7] Out: 1230 [Urine:850; Chest Tube:380] Intake/Output this shift: Total I/O In: -  Out: 30 [Chest Tube:30]  General appearance: alert, cooperative and no distress Neurologic: intact Heart: regular rate and rhythm Lungs: diminished breath sounds bilaterally small air leak  Lab Results: Recent Labs    02/16/18 0745 02/17/18 0418  WBC 6.4 6.3  HGB 9.8* 9.7*  HCT 30.8* 29.5*  PLT 198 187   BMET:  Recent Labs    02/16/18 0745 02/17/18 0418  NA 137 134*  K 4.3 3.6  CL 104 101  CO2 26 26  GLUCOSE 126* 111*  BUN 12 8  CREATININE 0.81 0.64  CALCIUM 7.7* 8.2*    PT/INR: No results for input(s): LABPROT, INR in the last 72 hours. ABG    Component Value Date/Time   PHART 7.312 (L) 02/16/2018 0355   HCO3 21.5 02/16/2018 0355   ACIDBASEDEF 3.7 (H) 02/16/2018 0355   O2SAT 99.0 02/16/2018 0355   CBG (last 3)  No results for input(s): GLUCAP in the last 72 hours.  Assessment/Plan: S/P Procedure(s) (LRB): VIDEO ASSISTED THORACOSCOPY (VATS)/LEFT UPPER LOBECTOMY (Left) -POD # 2 Small air leak- will try on water seal today Continue Dulera, albuterol Ambulate TID SCD + enoxaparin for DVT prophylaxis Supplement K DC Foley Anemia secondary to ABL- expected postop  LOS: 2 days    Melrose Nakayama 02/17/2018

## 2018-02-17 NOTE — Progress Notes (Signed)
      [address]       [place] [address]             [phone]      2 Days Post-Op Procedure(s) (LRB): VIDEO ASSISTED THORACOSCOPY (VATS)/LEFT UPPER LOBECTOMY (Left)   Subjective:  Some Pain.  Did not get out of bed yesterday  Objective: Vital signs in last 24 hours: Temp:  [98.1 F (36.7 C)-98.8 F (37.1 C)] 98.1 F (36.7 C) (05/17 0334) Pulse Rate:  [79-93] 79 (05/17 0334) Cardiac Rhythm: Normal sinus rhythm (05/17 0732) Resp:  [11-22] 19 (05/17 0732) BP: (106-116)/(60-83) 116/74 (05/17 0334) SpO2:  [97 %-100 %] 98 % (05/17 0732)  Intake/Output from previous day: 05/16 0701 - 05/17 0700 In: 1596.7 [P.O.:560; I.V.:1036.7] Out: 1230 [Urine:850; Chest Tube:380] Intake/Output this shift: Total I/O In: -  Out: 30 [Chest Tube:30]  General appearance: alert, cooperative and no distress Heart: regular rate and rhythm Lungs: clear to auscultation bilaterally Abdomen: soft, non-tender; bowel sounds normal; no masses,  no organomegaly Extremities: extremities normal, atraumatic, no cyanosis or edema Wound: clean and dry  Lab Results: Recent Labs    02/16/18 0745 02/17/18 0418  WBC 6.4 6.3  HGB 9.8* 9.7*  HCT 30.8* 29.5*  PLT 198 187   BMET:  Recent Labs    02/16/18 0745 02/17/18 0418  NA 137 134*  K 4.3 3.6  CL 104 101  CO2 26 26  GLUCOSE 126* 111*  BUN 12 8  CREATININE 0.81 0.64  CALCIUM 7.7* 8.2*    PT/INR: No results for input(s): LABPROT, INR in the last 72 hours. ABG    Component Value Date/Time   PHART 7.312 (L) 02/16/2018 0355   HCO3 21.5 02/16/2018 0355   ACIDBASEDEF 3.7 (H) 02/16/2018 0355   O2SAT 99.0 02/16/2018 0355   CBG (last 3)  No results for input(s): GLUCAP in the last 72 hours.  Assessment/Plan: S/P Procedure(s) (LRB): VIDEO ASSISTED THORACOSCOPY (VATS)/LEFT UPPER LOBECTOMY (Left)  1. Chest tube- no air leak, mild tidaling, output remains low- will try on water seal today 2. Pulm- continue pulm toilet,  wean oxygen as tolerated 3. CV- NSR, BP controlled continue home antihypertensive agents 4. D/C Foley catheter 5. DVT prophylaxis continue Lovenox 6. Activity- patient needs to get up and out of bed, needs to ambulate today 7. Dispo- patient stable, chest tube to water seal, d/c foley, repeat CXR in AM   LOS: 2 days    Ellwood Handler 02/17/2018

## 2018-02-17 NOTE — Plan of Care (Signed)
  Problem: Activity: Goal: Risk for activity intolerance will decrease Outcome: Progressing   Pt up to the chair. Modified independent, requiring more time and assistance with chest tube.

## 2018-02-18 ENCOUNTER — Inpatient Hospital Stay (HOSPITAL_COMMUNITY): Payer: BLUE CROSS/BLUE SHIELD

## 2018-02-18 LAB — BASIC METABOLIC PANEL
Anion gap: 7 (ref 5–15)
BUN: 5 mg/dL — ABNORMAL LOW (ref 6–20)
CO2: 30 mmol/L (ref 22–32)
Calcium: 8.9 mg/dL (ref 8.9–10.3)
Chloride: 101 mmol/L (ref 101–111)
Creatinine, Ser: 0.66 mg/dL (ref 0.44–1.00)
GFR calc Af Amer: >60 mL/min (ref >60)
GFR calc non Af Amer: >60 mL/min (ref >60)
Glucose, Bld: 99 mg/dL (ref 65–99)
Potassium: 4.3 mmol/L (ref 3.5–5.1)
Sodium: 138 mmol/L (ref 135–145)

## 2018-02-18 MED ORDER — ALBUTEROL SULFATE (2.5 MG/3ML) 0.083% IN NEBU: 2.5000 mg | INHALATION_SOLUTION | Freq: Four times a day (QID) | RESPIRATORY_TRACT | Status: DC | PRN

## 2018-02-18 MED ORDER — FENTANYL 40 MCG/ML IV SOLN: INTRAVENOUS | Status: DC

## 2018-02-18 MED ORDER — FENTANYL 40 MCG/ML IV SOLN
INTRAVENOUS | Status: AC
  Administered 2018-02-18: 45 ug via INTRAVENOUS

## 2018-02-18 NOTE — Progress Notes (Signed)
[address]       [place] [address]             [phone]                 3 Days Post-Op Procedure(s) (LRB): VIDEO ASSISTED THORACOSCOPY (VATS)/LEFT UPPER LOBECTOMY (Left)  LOS: 3 days   Subjective: Patient up ambulating, good cough   Objective: Vital signs in last 24 hours: Patient Vitals for the past 24 hrs:  BP Temp Temp src Pulse Resp SpO2  02/18/18 0756 127/75 98.4 F (36.9 C) Oral 97 18 96 %  02/18/18 0722 - - - - 20 100 %  02/18/18 0719 - - - - - 100 %  02/18/18 0714 - - - (!) 101 20 98 %  02/18/18 0433 129/84 98.1 F (36.7 C) Oral 78 (!) 25 97 %  02/18/18 0410 - - - - 17 97 %  02/17/18 2344 - - - - 19 96 %  02/17/18 2336 96/64 98.3 F (36.8 C) Oral 86 20 96 %  02/17/18 1930 121/69 98.9 F (37.2 C) Oral (!) 103 17 98 %  02/17/18 1724 - - - (!) 113 18 92 %  02/17/18 1617 - - - - 19 97 %  02/17/18 1508 133/69 98.5 F (36.9 C) Oral 90 20 100 %  02/17/18 1422 - - - - - 95 %  02/17/18 1218 - - - - 15 100 %  02/17/18 1116 118/67 98.4 F (36.9 C) Oral 97 17 99 %    Filed Weights   02/15/18 0949 02/15/18 1700  Weight: 124 lb 4.8 oz (56.4 kg) 126 lb 1.7 oz (57.2 kg)    Hemodynamic parameters for last 24 hours:    Intake/Output from previous day: 05/17 0701 - 05/18 0700 In: 886.7 [P.O.:480; I.V.:406.7] Out: 2325 [Urine:1925; Chest Tube:400] Intake/Output this shift: Total I/O In: 0  Out: 70 [Chest Tube:70]  Scheduled Meds: . acetaminophen  1,000 mg Oral Q6H   Or  . acetaminophen (TYLENOL) oral liquid 160 mg/5 mL  1,000 mg Oral Q6H  . albuterol  2.5 mg Nebulization TID  . bisacodyl  10 mg Oral Daily  . enoxaparin (LOVENOX) injection  40 mg Subcutaneous Q24H  . fentaNYL   Intravenous Q4H  . lisinopril  40 mg Oral Daily  . mouth rinse  15 mL Mouth Rinse BID  . mometasone-formoterol  2 puff Inhalation BID  . senna-docusate  1 tablet Oral QHS   Continuous Infusions: . sodium chloride 10 mL/hr at 02/17/18 1900  . potassium  chloride     PRN Meds:.diphenhydrAMINE **OR** diphenhydrAMINE, naloxone **AND** sodium chloride flush, ondansetron (ZOFRAN) IV, oxyCODONE, potassium chloride, traMADol  General appearance: alert and cooperative Neurologic: intact Heart: regular rate and rhythm, S1, S2 normal, no murmur, click, rub or gallop Lungs: clear to auscultation bilaterally Wound: No air leak from chest tube  Lab Results: CBC: Recent Labs    02/16/18 0745 02/17/18 0418  WBC 6.4 6.3  HGB 9.8* 9.7*  HCT 30.8* 29.5*  PLT 198 187   BMET:  Recent Labs    02/17/18 0418 02/18/18 0421  NA 134* 138  K 3.6 4.3  CL 101 101  CO2 26 30  GLUCOSE 111* 99  BUN 8 5*  CREATININE 0.64 0.66  CALCIUM 8.2* 8.9    PT/INR: No results for input(s): LABPROT, INR in the last 72 hours.   Radiology Dg Chest Port 1 View  Result Date: 02/18/2018 CLINICAL  DATA:  Status post lobectomy EXAM: PORTABLE CHEST 1 VIEW COMPARISON:  Yesterday FINDINGS: Small left apical pneumothorax, less than 10%. No convincing change from yesterday. Left-sided chest tube with tip at the apex is unchanged. Right IJ line with tip at the upper cavoatrial junction. Volume loss and mild atelectasis on the left. Stable chest wall emphysema on the left. Normal heart size. IMPRESSION: Stable postoperative chest including small left apical pneumothorax. Electronically Signed   By: Monte Fantasia M.D.   On: 02/18/2018 08:43   Dg Chest Port 1 View  Result Date: 02/17/2018 CLINICAL DATA:  Status post left lobectomy. EXAM: PORTABLE CHEST 1 VIEW COMPARISON:  Feb 16, 2018 FINDINGS: A left chest tube remains in place. The left-sided pneumothorax measures 10 mm in thickness at the lateral apex today versus 15 mm yesterday. The pneumothorax is slightly smaller. Subcutaneous air remains in the left chest wall. The right central line is stable terminating in the central SVC. The cardiomediastinal silhouette is unchanged. Mild atelectasis at the left base. The lungs are  otherwise clear. Healed right rib fracture. IMPRESSION: 1. Stable left chest tube. The left-sided pneumothorax is slightly smaller in the interval. Recommend continued attention on follow-up. No other changes. Electronically Signed   By: Dorise Bullion III M.D   On: 02/17/2018 07:35     Assessment/Plan: S/P Procedure(s) (LRB): VIDEO ASSISTED THORACOSCOPY (VATS)/LEFT UPPER LOBECTOMY (Left) Mobilize d/c tubes/lines Plan to remove chest tube today, good fluid variation today no air leak DC PCA pump Expected blood loss anemia  Grace Isaac MD 02/18/2018 10:10 AM     Patient ID: Aimee Lewis, female   DOB: May 25, 1961, 57 y.o.   MRN: [id number]

## 2018-02-18 NOTE — Progress Notes (Signed)
Chest tube removed per order by C. Vickki Muff, RN.  Pt. Tolerated well. Oxygen saturations 96% on RA.  HR 96.  Pt denies any increased SOB or chest pain with removal of chest tube.

## 2018-02-19 ENCOUNTER — Inpatient Hospital Stay (HOSPITAL_COMMUNITY): Payer: BLUE CROSS/BLUE SHIELD

## 2018-02-19 MED ORDER — ACETAMINOPHEN 500 MG PO TABS: 1000.0000 mg | ORAL_TABLET | Freq: Four times a day (QID) | ORAL | 0 refills | Status: DC

## 2018-02-19 MED ORDER — TRAMADOL HCL 50 MG PO TABS: 50.0000 mg | ORAL_TABLET | Freq: Four times a day (QID) | ORAL | 0 refills | Status: DC | PRN

## 2018-02-19 NOTE — Progress Notes (Signed)
Discharged home accompanied by husband, discharge instructions given. Belongings taken home

## 2018-02-19 NOTE — Progress Notes (Signed)
Patient ID: Aimee Lewis, female   DOB: 07-Apr-1961, 57 y.o.   MRN: [id number]      [address]       [place] [address]             [phone]                 4 Days Post-Op Procedure(s) (LRB): VIDEO ASSISTED THORACOSCOPY (VATS)/LEFT UPPER LOBECTOMY (Left)  LOS: 4 days   Subjective: Patient feels well this morning  Objective: Vital signs in last 24 hours: Patient Vitals for the past 24 hrs:  BP Temp Temp src Pulse Resp SpO2  02/19/18 0747 - - - - - 97 %  02/19/18 0745 (!) 119/91 98 F (36.7 C) Oral (!) 106 17 97 %  02/19/18 0742 - - - - - 97 %  02/19/18 0422 (!) 121/92 98.2 F (36.8 C) Oral 96 20 98 %  02/18/18 2349 135/83 98.3 F (36.8 C) Oral 86 18 100 %  02/18/18 2007 125/78 (!) 97.5 F (36.4 C) Oral 89 20 98 %  02/18/18 1957 - - - - - 95 %  02/18/18 1603 114/83 98.5 F (36.9 C) Oral 90 19 100 %  02/18/18 1357 - - - (!) 108 18 98 %  02/18/18 1121 - - - - 18 100 %  02/18/18 1052 134/74 98.6 F (37 C) Oral 86 (!) 22 97 %    Filed Weights   02/15/18 0949 02/15/18 1700  Weight: 124 lb 4.8 oz (56.4 kg) 126 lb 1.7 oz (57.2 kg)    Hemodynamic parameters for last 24 hours:    Intake/Output from previous day: 05/18 0701 - 05/19 0700 In: 720 [P.O.:720] Out: 370 [Urine:300; Chest Tube:70] Intake/Output this shift: Total I/O In: 240 [P.O.:240] Out: -   Scheduled Meds: . acetaminophen  1,000 mg Oral Q6H   Or  . acetaminophen (TYLENOL) oral liquid 160 mg/5 mL  1,000 mg Oral Q6H  . albuterol  2.5 mg Nebulization TID  . bisacodyl  10 mg Oral Daily  . enoxaparin (LOVENOX) injection  40 mg Subcutaneous Q24H  . lisinopril  40 mg Oral Daily  . mouth rinse  15 mL Mouth Rinse BID  . mometasone-formoterol  2 puff Inhalation BID  . senna-docusate  1 tablet Oral QHS   Continuous Infusions: . sodium chloride 10 mL/hr at 02/17/18 1900  . potassium chloride     PRN Meds:.albuterol, diphenhydrAMINE **OR** diphenhydrAMINE, naloxone **AND** sodium  chloride flush, ondansetron (ZOFRAN) IV, oxyCODONE, potassium chloride, traMADol  General appearance: alert and cooperative Neurologic: intact Heart: regular rate and rhythm, S1, S2 normal, no murmur, click, rub or gallop Lungs: clear to auscultation bilaterally Abdomen: soft, non-tender; bowel sounds normal; no masses,  no organomegaly Extremities: extremities normal, atraumatic, no cyanosis or edema and Homans sign is negative, no sign of DVT Wound: Left chest wounds are intact  Lab Results: CBC: Recent Labs    02/17/18 0418  WBC 6.3  HGB 9.7*  HCT 29.5*  PLT 187   BMET:  Recent Labs    02/17/18 0418 02/18/18 0421  NA 134* 138  K 3.6 4.3  CL 101 101  CO2 26 30  GLUCOSE 111* 99  BUN 8 5*  CREATININE 0.64 0.66  CALCIUM 8.2* 8.9    PT/INR: No results for input(s): LABPROT, INR in the last 72 hours.   Radiology Dg Chest 2 View  Result Date: 02/19/2018 CLINICAL DATA:  Postop LEFT upper lobectomy EXAM: CHEST - 2  VIEW COMPARISON:  02/18/2018 FINDINGS: Removal of LEFT chest tube with slight increase in volume of small LEFT apical pneumothorax. The pleural edge measures 8 mm from the upper chest wall compared to 5 mm. Persistent elevation of LEFT hemidiaphragm. Subcutaneous gas along the LEFT chest wall unchanged. RIGHT lung is clear.  Central venous line unchanged. IMPRESSION: 1. Slight increase in volume of LEFT apical pneumothorax following chest tube removal. 2. Persistent elevation of the LEFT hemidiaphragm. Electronically Signed   By: Suzy Bouchard M.D.   On: 02/19/2018 08:49   Dg Chest Port 1 View  Result Date: 02/18/2018 CLINICAL DATA:  Status post lobectomy EXAM: PORTABLE CHEST 1 VIEW COMPARISON:  Yesterday FINDINGS: Small left apical pneumothorax, less than 10%. No convincing change from yesterday. Left-sided chest tube with tip at the apex is unchanged. Right IJ line with tip at the upper cavoatrial junction. Volume loss and mild atelectasis on the left. Stable  chest wall emphysema on the left. Normal heart size. IMPRESSION: Stable postoperative chest including small left apical pneumothorax. Electronically Signed   By: Monte Fantasia M.D.   On: 02/18/2018 08:43   I have independently reviewed the above radiology studies  and reviewed the findings with the patient.   Assessment/Plan: S/P Procedure(s) (LRB): VIDEO ASSISTED THORACOSCOPY (VATS)/LEFT UPPER LOBECTOMY (Left) Plan for discharge: see discharge orders Patient stable for discharge Wound care instructions, and postop instructions given to the patient  Grace Isaac MD 02/19/2018 9:55 AM

## 2018-02-19 NOTE — Progress Notes (Signed)
Central  Line to right IJ d/ced , catheter  out completely. Tolerated well. Instructed to be on bed rest for 30 min. No bleeding noted. Continue  to monitor.

## 2018-02-21 ENCOUNTER — Ambulatory Visit (INDEPENDENT_AMBULATORY_CARE_PROVIDER_SITE_OTHER): Payer: BLUE CROSS/BLUE SHIELD | Admitting: Thoracic Surgery (Cardiothoracic Vascular Surgery)

## 2018-02-21 ENCOUNTER — Other Ambulatory Visit: Payer: Self-pay | Admitting: Thoracic Surgery (Cardiothoracic Vascular Surgery)

## 2018-02-21 ENCOUNTER — Telehealth: Payer: Self-pay

## 2018-02-21 ENCOUNTER — Inpatient Hospital Stay (HOSPITAL_COMMUNITY)
Admission: EM | Admit: 2018-02-21 | Discharge: 2018-02-27 | DRG: 200 | Disposition: A | Discharge status: Home / Self Care | Payer: BLUE CROSS/BLUE SHIELD | Attending: Thoracic Surgery (Cardiothoracic Vascular Surgery) | Admitting: Thoracic Surgery (Cardiothoracic Vascular Surgery)

## 2018-02-21 ENCOUNTER — Other Ambulatory Visit: Payer: Self-pay

## 2018-02-21 ENCOUNTER — Encounter: Payer: Self-pay | Admitting: Thoracic Surgery (Cardiothoracic Vascular Surgery)

## 2018-02-21 ENCOUNTER — Emergency Department (HOSPITAL_COMMUNITY): Payer: BLUE CROSS/BLUE SHIELD

## 2018-02-21 ENCOUNTER — Ambulatory Visit
Admission: RE | Admit: 2018-02-21 | Discharge: 2018-02-21 | Disposition: A | Discharge status: Home / Self Care | Payer: BLUE CROSS/BLUE SHIELD | Source: Ambulatory Visit | Attending: Thoracic Surgery (Cardiothoracic Vascular Surgery) | Admitting: Thoracic Surgery (Cardiothoracic Vascular Surgery)

## 2018-02-21 ENCOUNTER — Encounter (HOSPITAL_COMMUNITY): Payer: Self-pay | Admitting: Emergency Medicine

## 2018-02-21 VITALS — BP 113/77 | HR 53 | Resp 18 | Ht 62.5 in | Wt 122.8 lb

## 2018-02-21 DIAGNOSIS — Z885 Allergy status to narcotic agent status: Secondary | ICD-10-CM | POA: Diagnosis not present

## 2018-02-21 DIAGNOSIS — Z902 Acquired absence of lung [part of]: Secondary | ICD-10-CM | POA: Diagnosis not present

## 2018-02-21 DIAGNOSIS — C3412 Malignant neoplasm of upper lobe, left bronchus or lung: Secondary | ICD-10-CM | POA: Diagnosis not present

## 2018-02-21 DIAGNOSIS — Z4682 Encounter for fitting and adjustment of non-vascular catheter: Secondary | ICD-10-CM | POA: Diagnosis not present

## 2018-02-21 DIAGNOSIS — Z9842 Cataract extraction status, left eye: Secondary | ICD-10-CM

## 2018-02-21 DIAGNOSIS — C3490 Malignant neoplasm of unspecified part of unspecified bronchus or lung: Secondary | ICD-10-CM | POA: Diagnosis not present

## 2018-02-21 DIAGNOSIS — J449 Chronic obstructive pulmonary disease, unspecified: Secondary | ICD-10-CM | POA: Diagnosis not present

## 2018-02-21 DIAGNOSIS — Z9841 Cataract extraction status, right eye: Secondary | ICD-10-CM | POA: Diagnosis not present

## 2018-02-21 DIAGNOSIS — J939 Pneumothorax, unspecified: Secondary | ICD-10-CM | POA: Diagnosis not present

## 2018-02-21 DIAGNOSIS — Z961 Presence of intraocular lens: Secondary | ICD-10-CM | POA: Diagnosis not present

## 2018-02-21 DIAGNOSIS — I10 Essential (primary) hypertension: Secondary | ICD-10-CM | POA: Diagnosis not present

## 2018-02-21 DIAGNOSIS — F17210 Nicotine dependence, cigarettes, uncomplicated: Secondary | ICD-10-CM | POA: Diagnosis present

## 2018-02-21 DIAGNOSIS — C3492 Malignant neoplasm of unspecified part of left bronchus or lung: Secondary | ICD-10-CM

## 2018-02-21 DIAGNOSIS — K5900 Constipation, unspecified: Secondary | ICD-10-CM | POA: Diagnosis present

## 2018-02-21 DIAGNOSIS — J9311 Primary spontaneous pneumothorax: Secondary | ICD-10-CM | POA: Diagnosis not present

## 2018-02-21 DIAGNOSIS — J439 Emphysema, unspecified: Secondary | ICD-10-CM | POA: Diagnosis not present

## 2018-02-21 HISTORY — DX: Pneumothorax, unspecified: J93.9

## 2018-02-21 HISTORY — DX: Malignant neoplasm of unspecified part of unspecified bronchus or lung: C34.90

## 2018-02-21 HISTORY — DX: Unspecified chronic bronchitis: J42

## 2018-02-21 LAB — BASIC METABOLIC PANEL
Anion gap: 10 (ref 5–15)
BUN: 18 mg/dL (ref 6–20)
CO2: 28 mmol/L (ref 22–32)
Calcium: 9.4 mg/dL (ref 8.9–10.3)
Chloride: 101 mmol/L (ref 101–111)
Creatinine, Ser: 0.75 mg/dL (ref 0.44–1.00)
GFR calc Af Amer: >60 mL/min (ref >60)
GFR calc non Af Amer: >60 mL/min (ref >60)
Glucose, Bld: 93 mg/dL (ref 65–99)
Potassium: 3.9 mmol/L (ref 3.5–5.1)
Sodium: 139 mmol/L (ref 135–145)

## 2018-02-21 LAB — CBC WITH DIFFERENTIAL/PLATELET
Abs Immature Granulocytes: 0 10*3/uL (ref 0.0–0.1)
Basophils Absolute: 0 10*3/uL (ref 0.0–0.1)
Basophils Relative: 0 %
Eosinophils Absolute: 0.1 10*3/uL (ref 0.0–0.7)
Eosinophils Relative: 2 %
HCT: 34.4 % — ABNORMAL LOW (ref 36.0–46.0)
Hemoglobin: 11.3 g/dL — ABNORMAL LOW (ref 12.0–15.0)
Immature Granulocytes: 0 %
Lymphocytes Relative: 28 %
Lymphs Abs: 1.3 10*3/uL (ref 0.7–4.0)
MCH: 32.3 pg (ref 26.0–34.0)
MCHC: 32.8 g/dL (ref 30.0–36.0)
MCV: 98.3 fL (ref 78.0–100.0)
Monocytes Absolute: 0.5 10*3/uL (ref 0.1–1.0)
Monocytes Relative: 11 %
Neutro Abs: 2.8 10*3/uL (ref 1.7–7.7)
Neutrophils Relative %: 59 %
Platelets: 350 10*3/uL (ref 150–400)
RBC: 3.5 MIL/uL — ABNORMAL LOW (ref 3.87–5.11)
RDW: 12.8 % (ref 11.5–15.5)
WBC: 4.6 10*3/uL (ref 4.0–10.5)

## 2018-02-21 MED ORDER — ZOLPIDEM TARTRATE 5 MG PO TABS
5.0000 mg | ORAL_TABLET | Freq: Every evening | ORAL | Status: DC | PRN
  Administered 2018-02-21: 5 mg via ORAL
  Filled 2018-02-21: qty 1

## 2018-02-21 MED ORDER — SODIUM CHLORIDE 0.9 % IV SOLN
INTRAVENOUS | Status: DC
  Administered 2018-02-21: 19:00:00 via INTRAVENOUS

## 2018-02-21 MED ORDER — MOMETASONE FURO-FORMOTEROL FUM 100-5 MCG/ACT IN AERO
2.0000 | INHALATION_SPRAY | Freq: Two times a day (BID) | RESPIRATORY_TRACT | Status: DC
  Administered 2018-02-22 – 2018-02-27 (×11): 2 via RESPIRATORY_TRACT
  Filled 2018-02-21: qty 8.8

## 2018-02-21 MED ORDER — LISINOPRIL 40 MG PO TABS
40.0000 mg | ORAL_TABLET | Freq: Every day | ORAL | Status: DC
  Administered 2018-02-22 – 2018-02-27 (×6): 40 mg via ORAL
  Filled 2018-02-21 (×6): qty 1

## 2018-02-21 MED ORDER — MAGNESIUM HYDROXIDE 400 MG/5ML PO SUSP: 15.0000 mL | Freq: Every day | ORAL | Status: DC | PRN

## 2018-02-21 MED ORDER — NALOXONE HCL 0.4 MG/ML IJ SOLN: 0.4000 mg | INTRAMUSCULAR | Status: DC | PRN

## 2018-02-21 MED ORDER — DIPHENHYDRAMINE HCL 50 MG/ML IJ SOLN: 12.5000 mg | Freq: Four times a day (QID) | INTRAMUSCULAR | Status: DC | PRN

## 2018-02-21 MED ORDER — DIPHENHYDRAMINE HCL 12.5 MG/5ML PO ELIX
12.5000 mg | ORAL_SOLUTION | Freq: Four times a day (QID) | ORAL | Status: DC | PRN
  Filled 2018-02-21: qty 5

## 2018-02-21 MED ORDER — FLUTICASONE PROPIONATE 50 MCG/ACT NA SUSP: 1.0000 | Freq: Every day | NASAL | Status: DC | PRN

## 2018-02-21 MED ORDER — IBUPROFEN 400 MG PO TABS: 400.0000 mg | ORAL_TABLET | Freq: Every day | ORAL | Status: DC | PRN

## 2018-02-21 MED ORDER — FENTANYL 40 MCG/ML IV SOLN
INTRAVENOUS | Status: DC
  Administered 2018-02-21: 15 ug via INTRAVENOUS
  Administered 2018-02-21: 30 ug via INTRAVENOUS
  Administered 2018-02-21: 1000 ug via INTRAVENOUS
  Administered 2018-02-22: 15 ug via INTRAVENOUS
  Administered 2018-02-23 (×2): 0 ug via INTRAVENOUS
  Filled 2018-02-21: qty 25

## 2018-02-21 MED ORDER — SODIUM CHLORIDE 0.9% FLUSH: 9.0000 mL | INTRAVENOUS | Status: DC | PRN

## 2018-02-21 MED ORDER — MORPHINE SULFATE (PF) 4 MG/ML IV SOLN
2.0000 mg | Freq: Once | INTRAVENOUS | Status: AC
  Administered 2018-02-21: 2 mg via INTRAVENOUS
  Filled 2018-02-21: qty 1

## 2018-02-21 MED ORDER — ONDANSETRON HCL 4 MG/2ML IJ SOLN: 4.0000 mg | Freq: Four times a day (QID) | INTRAMUSCULAR | Status: DC | PRN

## 2018-02-21 MED ORDER — ACETAMINOPHEN 500 MG PO TABS
1000.0000 mg | ORAL_TABLET | Freq: Four times a day (QID) | ORAL | Status: DC
  Administered 2018-02-21 – 2018-02-27 (×21): 1000 mg via ORAL
  Filled 2018-02-21 (×20): qty 2

## 2018-02-21 MED ORDER — ENOXAPARIN SODIUM 40 MG/0.4ML ~~LOC~~ SOLN
40.0000 mg | SUBCUTANEOUS | Status: DC
  Administered 2018-02-21 – 2018-02-26 (×6): 40 mg via SUBCUTANEOUS
  Filled 2018-02-21 (×6): qty 0.4

## 2018-02-21 MED ORDER — MORPHINE SULFATE (PF) 4 MG/ML IV SOLN: 2.0000 mg | Freq: Once | INTRAVENOUS | Status: DC

## 2018-02-21 MED ORDER — MIDAZOLAM HCL 2 MG/2ML IJ SOLN
2.0000 mg | Freq: Once | INTRAMUSCULAR | Status: AC
  Administered 2018-02-21: 2 mg via INTRAVENOUS
  Filled 2018-02-21: qty 2

## 2018-02-21 NOTE — H&P (Signed)
Aimee Lewis is an 57 y.o. female.   Chief Complaint: increased swelling HPI: 57 yo woman with a history of tobacco abuse who underwent a thoracoscopic left upper lobectomy on 5/15 for a stage IA adenocarcinoma. She had an uncomplicated postop course. Yesterday she noted sudden onset of swelling of left chest after coughing. Not short of breath. Came to office earlier today and felt well. She thought swelling had gone down a little. We decided to observe.   This afternoon she had another increase in swelling and came to the ED.   Past Medical History:  Diagnosis Date  . COPD (chronic obstructive pulmonary disease) ([place])   . Hypertension   . Pneumonia     Past Surgical History:  Procedure Laterality Date  . BACK SURGERY    . CATARACT EXTRACTION W/ INTRAOCULAR LENS  IMPLANT, BILATERAL Bilateral 09/2017  . TONSILLECTOMY    . VIDEO ASSISTED THORACOSCOPY (VATS)/ LOBECTOMY Left 02/15/2018   Procedure: VIDEO ASSISTED THORACOSCOPY (VATS)/LEFT UPPER LOBECTOMY;  Surgeon: Melrose Nakayama, MD;  Location: [facility];  Service: Thoracic;  Laterality: Left;  Marland Kitchen VIDEO BRONCHOSCOPY WITH ENDOBRONCHIAL NAVIGATION N/A 01/18/2018   Procedure: VIDEO BRONCHOSCOPY WITH ENDOBRONCHIAL NAVIGATION WITH BIOPSIES;  Surgeon: Collene Gobble, MD;  Location: [facility];  Service: Thoracic;  Laterality: N/A;    No family history on file. Social History:  reports that she has been smoking cigarettes.  She has been smoking about 0.50 packs per day. She has never used smokeless tobacco. She reports that she drinks alcohol. She reports that she does not use drugs.  Allergies:  Allergies  Allergen Reactions  . Codeine Nausea And Vomiting  . Darvon [Propoxyphene] Nausea And Vomiting     (Not in a hospital admission)  No results found for this or any previous visit (from the past 48 hour(s)). Dg Chest 2 View  Result Date: 02/21/2018 CLINICAL DATA:  Status post VATS and left upper lobectomy 02/15/2018. Left chest pain  and shortness of breath. EXAM: CHEST - 2 VIEW COMPARISON:  PA and lateral chest 02/19/2017 and 02/18/2018. FINDINGS: Right IJ catheter has been removed since the most recent examination. Subcutaneous emphysema over the left chest has markedly increased since the most recent exam and there is new pneumomediastinum. Left pneumothorax has increased in size and is estimated at 30-35%. There is a small left pleural effusion. The right lung is clear. Heart size is normal. Remote right rib fracture and scoliosis noted. IMPRESSION: Increase in the size of a left pneumothorax now estimated 30-35%. Marked increase in subcutaneous emphysema and new pneumomediastinum. Critical Value/emergent results were called by telephone at the time of interpretation on 02/21/2018 at 9:58 am to [facility], RN , who verbally acknowledged these results. Electronically Signed   By: Inge Rise M.D.   On: 02/21/2018 10:03    ROS  Blood pressure 111/68, pulse (!) 103, temperature 98.9 F (37.2 C), resp. rate 20, SpO2 97 %. Physical Exam  Constitutional: She appears well-developed and well-nourished. No distress.  HENT:  Head: Normocephalic and atraumatic.  Mouth/Throat: No oropharyngeal exudate.  Eyes: Pupils are equal, round, and reactive to light. EOM are normal.  Neck: No thyromegaly present.  SQ emphysema  Cardiovascular: Normal rate, regular rhythm and normal heart sounds.  No murmur heard. Respiratory: Effort normal. She has no wheezes.  Extensive SQ emphysema  GI: Soft. She exhibits no distension. There is no tenderness.  Musculoskeletal: She exhibits no edema.  Lymphadenopathy:    She has no cervical adenopathy.  Neurological: No cranial nerve deficit. She exhibits normal muscle tone.  Skin: Skin is warm and dry.     Assessment/Plan 57 yo woman who is about a week out from a left upper lobectomy who has a pneumothorax with subcutaneous emphysema. Given the progression she needs chest tube placement.  She  is aware of the indications, risks, benefits and alternatives. She agrees to proceed.  Will admit   Melrose Nakayama, MD 02/21/2018, 4:55 PM

## 2018-02-21 NOTE — Procedures (Signed)
Informed consent obtained  Premedicated with 2 mg of versed and 2 mg of morphine IV  Sterile technique.  15 ml 1% lidocaine local  14 F pigtail catheter placed in left pleural space using Seldinger technique  + air leak  Tolerated well  Remo Lipps C. Roxan Hockey, MD [facility] [phone]

## 2018-02-21 NOTE — ED Provider Notes (Signed)
[facility] EMERGENCY DEPARTMENT Provider Note  CSN: [id number] Arrival date & time: 02/21/18 1538  Chief Complaint(s) pnuemothorax  HPI Aimee Lewis is a 57 y.o. female with a history of hypertension, COPD, recently diagnosed with non-small cell lung cancer status post lobectomy who presents to the emergency department with known pneumothorax on the left-hand side.  Patient reports that she noted some facial swelling yesterday which worsened earlier today endorsed intermittent coughing following the surgery.  Denies any fevers or chills.  She denies any shortness of breath.  No chest pain.  No abdominal pain.  No nausea or vomiting.  Patient was seen in the office by Dr. Roxan Hockey to obtain chest x-ray confirming the pneumothorax.  He sent her here to the emergency department for chest tube placement and admission.  HPI  Past Medical History Past Medical History:  Diagnosis Date  . COPD (chronic obstructive pulmonary disease) ([place])   . Hypertension   . Pneumonia    Patient Active Problem List   Diagnosis Date Noted  . Pneumothorax 02/21/2018  . S/P lobectomy of lung 02/15/2018  . Non-small cell lung cancer, left ([place]) 01/25/2018  . Abnormal CT of the chest 01/18/2018   Home Medication(s) Prior to Admission medications   Medication Sig Start Date End Date Taking? Authorizing Provider  acetaminophen (TYLENOL) 500 MG tablet Take 2 tablets (1,000 mg total) by mouth every 6 (six) hours. 02/19/18  Yes Conte, Tessa N, PA-C  fluticasone (FLONASE) 50 MCG/ACT nasal spray Place 1 spray into both nostrils daily as needed for allergies or rhinitis.   Yes [provider]  Fluticasone-Salmeterol (ADVAIR) 100-50 MCG/DOSE AEPB Inhale 1 puff into the lungs daily as needed (shortness of breath).   Yes [provider]  ibuprofen (ADVIL,MOTRIN) 200 MG tablet Take 400 mg by mouth daily as needed for headache or moderate pain.    Yes [provider]    lisinopril (PRINIVIL,ZESTRIL) 40 MG tablet Take 40 mg by mouth daily.   Yes [provider]  traMADol (ULTRAM) 50 MG tablet Take 1-2 tablets (50-100 mg total) by mouth every 6 (six) hours as needed (mild pain). 02/19/18  Yes Elgie Collard, PA-C                                                                                                                                    Past Surgical History Past Surgical History:  Procedure Laterality Date  . BACK SURGERY    . CATARACT EXTRACTION W/ INTRAOCULAR LENS  IMPLANT, BILATERAL Bilateral 09/2017  . TONSILLECTOMY    . VIDEO ASSISTED THORACOSCOPY (VATS)/ LOBECTOMY Left 02/15/2018   Procedure: VIDEO ASSISTED THORACOSCOPY (VATS)/LEFT UPPER LOBECTOMY;  Surgeon: Melrose Nakayama, MD;  Location: [facility];  Service: Thoracic;  Laterality: Left;  Marland Kitchen VIDEO BRONCHOSCOPY WITH ENDOBRONCHIAL NAVIGATION N/A 01/18/2018   Procedure: VIDEO BRONCHOSCOPY WITH ENDOBRONCHIAL NAVIGATION WITH BIOPSIES;  Surgeon: Collene Gobble, MD;  Location:  [facility];  Service: Thoracic;  Laterality: N/A;   Family History No family history on file.  Social History Social History   Tobacco Use  . Smoking status: Current Some Day Smoker    Packs/day: 0.50    Types: Cigarettes  . Smokeless tobacco: Never Used  Substance Use Topics  . Alcohol use: Yes    Comment: socially  . Drug use: No   Allergies Codeine and Darvon [propoxyphene]  Review of Systems Review of Systems All other systems are reviewed and are negative for acute change except as noted in the HPI  Physical Exam Vital Signs  I have reviewed the triage vital signs BP 115/79   Pulse 79   Temp 98.9 F (37.2 C)   Resp 16   SpO2 97%   Physical Exam  Constitutional: She is oriented to person, place, and time. She appears well-developed and well-nourished. No distress.  HENT:  Head: Normocephalic and atraumatic.  Nose: Nose normal.  Eyes: Pupils are equal, round, and reactive to light. Conjunctivae  and EOM are normal. Right eye exhibits no discharge. Left eye exhibits no discharge. No scleral icterus.  Neck: Normal range of motion. Neck supple.  Cardiovascular: Normal rate and regular rhythm. Exam reveals no gallop and no friction rub.  No murmur heard. Pulmonary/Chest: Effort normal and breath sounds normal. No stridor. No respiratory distress. She has no rales.  Abdominal: Soft. She exhibits no distension. There is no tenderness.  Musculoskeletal: She exhibits no edema or tenderness.  Subcutaneous emphysema palpable to bilateral neck and left side of the chest/back.  Neurological: She is alert and oriented to person, place, and time.  Skin: Skin is warm and dry. No rash noted. She is not diaphoretic. No erythema.  Psychiatric: She has a normal mood and affect.  Vitals reviewed.   ED Results and Treatments Labs (all labs ordered are listed, but only abnormal results are displayed) Labs Reviewed  CBC WITH DIFFERENTIAL/PLATELET - Abnormal; Notable for the following components:      Result Value   RBC 3.50 (*)    Hemoglobin 11.3 (*)    HCT 34.4 (*)    All other components within normal limits  BASIC METABOLIC PANEL                                                                                                                         EKG  EKG Interpretation  Date/Time:    Ventricular Rate:    PR Interval:    QRS Duration:   QT Interval:    QTC Calculation:   R Axis:     Text Interpretation:        Radiology Dg Chest 2 View  Result Date: 02/21/2018 CLINICAL DATA:  Status post VATS and left upper lobectomy 02/15/2018. Left chest pain and shortness of breath. EXAM: CHEST - 2 VIEW COMPARISON:  PA and lateral chest 02/19/2017 and 02/18/2018. FINDINGS: Right IJ catheter has been removed since the most recent examination. Subcutaneous emphysema  over the left chest has markedly increased since the most recent exam and there is new pneumomediastinum. Left pneumothorax has  increased in size and is estimated at 30-35%. There is a small left pleural effusion. The right lung is clear. Heart size is normal. Remote right rib fracture and scoliosis noted. IMPRESSION: Increase in the size of a left pneumothorax now estimated 30-35%. Marked increase in subcutaneous emphysema and new pneumomediastinum. Critical Value/emergent results were called by telephone at the time of interpretation on 02/21/2018 at 9:58 am to [facility], RN , who verbally acknowledged these results. Electronically Signed   By: Inge Rise M.D.   On: 02/21/2018 10:03   Dg Chest Portable 1 View  Result Date: 02/21/2018 CLINICAL DATA:  Lung cancer.  Chest tube placement. EXAM: PORTABLE CHEST 1 VIEW COMPARISON:  02/21/2018 FINDINGS: Interval placement of left chest tube. Increasing subcutaneous emphysema throughout the left chest wall, and neck bilaterally. Small residual left apical pneumothorax, decreased in size since prior study, less than 5%. Elevation of the left hemidiaphragm. Postoperative changes on the left. Right lung clear. Heart is normal size. IMPRESSION: Interval placement of left chest tube with decreasing size of the left pneumothorax, now less than 5%. Increasing subcutaneous emphysema throughout the left chest wall and neck bilaterally. Electronically Signed   By: Rolm Baptise M.D.   On: 02/21/2018 18:05   Pertinent labs & imaging results that were available during my care of the patient were reviewed by me and considered in my medical decision making (see chart for details).  Medications Ordered in ED Medications  0.9 %  sodium chloride infusion (has no administration in time range)  naloxone [facility]) injection 0.4 mg (has no administration in time range)    And  sodium chloride flush (NS) 0.9 % injection 9 mL (has no administration in time range)  ondansetron (ZOFRAN) injection 4 mg (has no administration in time range)  diphenhydrAMINE (BENADRYL) injection 12.5 mg (has no  administration in time range)    Or  diphenhydrAMINE (BENADRYL) 12.5 MG/5ML elixir 12.5 mg (has no administration in time range)  fentaNYL 40 mcg/mL PCA injection (has no administration in time range)  fluticasone (FLONASE) 50 MCG/ACT nasal spray 1 spray (has no administration in time range)  mometasone-formoterol (DULERA) 100-5 MCG/ACT inhaler 2 puff (has no administration in time range)  ibuprofen (ADVIL,MOTRIN) tablet 400 mg (has no administration in time range)  lisinopril (PRINIVIL,ZESTRIL) tablet 40 mg (has no administration in time range)  acetaminophen (TYLENOL) tablet 1,000 mg (has no administration in time range)  zolpidem (AMBIEN) tablet 5 mg (has no administration in time range)  magnesium hydroxide (MILK OF MAGNESIA) suspension 15 mL (has no administration in time range)  enoxaparin (LOVENOX) injection 40 mg (has no administration in time range)  morphine 4 MG/ML injection 2 mg (2 mg Intravenous Given 02/21/18 1711)  midazolam (VERSED) injection 2 mg (2 mg Intravenous Given 02/21/18 1711)  morphine 4 MG/ML injection 2 mg (2 mg Intravenous Given 02/21/18 1744)  Procedures Procedures  (including critical care time)  Medical Decision Making / ED Course I have reviewed the nursing notes for this encounter and the patient's prior records (if available in EHR or on provided paperwork).    Dr. Roxan Hockey contacted who evaluated patient in the emergency department and placed a chest tube.  He will admit the patient for continued management.  Final Clinical Impression(s) / ED Diagnoses Final diagnoses:  Pneumothorax      This chart was dictated using voice recognition software.  Despite best efforts to proofread,  errors can occur which can change the documentation meaning.   Fatima Blank, MD 02/21/18 1816

## 2018-02-21 NOTE — Progress Notes (Signed)
[address]       [place] [address]             [phone]     HPI: Aimee Lewis returns after developing sudden onset of swelling yesterday afternoon  Aimee Lewis is a 57 year old woman with history of tobacco abuse who had a thoracoscopic left upper lobectomy on 02/15/2018 for a stage IB (T2, N0) adenocarcinoma.  Her postoperative course was uncomplicated and she went home on day 4.  Her chest x-ray on the day of discharge showed a small apical space.  Yesterday after coughing she noted a funny sensation in her chest and then developed swelling in her chest wall and breast left shoulder and up into her neck.  She called in last night I spoke to her on the phone.  She was not short of breath but was concerned about the swelling.  This morning she feels like the swelling has gone down slightly.  She denies shortness of breath.  She has some incisional pain.  Past Medical History:  Diagnosis Date  . COPD (chronic obstructive pulmonary disease) ([place])   . Hypertension   . Pneumonia     Current Outpatient Medications  Medication Sig Dispense Refill  . acetaminophen (TYLENOL) 500 MG tablet Take 2 tablets (1,000 mg total) by mouth every 6 (six) hours. 30 tablet 0  . fluticasone (FLONASE) 50 MCG/ACT nasal spray Place 1 spray into both nostrils daily as needed for allergies or rhinitis.    . Fluticasone-Salmeterol (ADVAIR) 100-50 MCG/DOSE AEPB Inhale 1 puff into the lungs daily as needed (shortness of breath).    Marland Kitchen ibuprofen (ADVIL,MOTRIN) 200 MG tablet Take 400 mg by mouth daily as needed for headache or moderate pain.     Marland Kitchen lisinopril (PRINIVIL,ZESTRIL) 40 MG tablet Take 40 mg by mouth daily.    . traMADol (ULTRAM) 50 MG tablet Take 1-2 tablets (50-100 mg total) by mouth every 6 (six) hours as needed (mild pain). 15 tablet 0   No current facility-administered medications for this visit.     Physical Exam BP 113/77 (BP Location: Left Arm, Cuff Size: Normal)    Pulse (!) 53   Resp 18   Ht 5' 2.5" (1.588 m)   Wt 122 lb 12.8 oz (55.7 kg)   SpO2 98% Comment: on RA  BMI 22.34 kg/m  57 year old woman in no acute distress Subcutaneous emphysema in left chest up into the left neck Lungs slightly diminished on left side Incision clean and dry  Diagnostic Tests: CHEST - 2 VIEW  COMPARISON:  PA and lateral chest 02/19/2017 and 02/18/2018.  FINDINGS: Right IJ catheter has been removed since the most recent examination. Subcutaneous emphysema over the left chest has markedly increased since the most recent exam and there is new pneumomediastinum. Left pneumothorax has increased in size and is estimated at 30-35%. There is a small left pleural effusion. The right lung is clear. Heart size is normal. Remote right rib fracture and scoliosis noted.  IMPRESSION: Increase in the size of a left pneumothorax now estimated 30-35%.  Marked increase in subcutaneous emphysema and new pneumomediastinum.  Critical Value/emergent results were called by telephone at the time of interpretation on 02/21/2018 at 9:58 am to [facility], RN , who verbally acknowledged these results.   Electronically Signed   By: Inge Rise M.D.   On: 02/21/2018 10:03 I personally reviewed the chest x-ray images and concur with the findings noted above.  I think the  pneumothorax is overestimated.  It definitely is larger than it was prior to discharge and she does have extensive subcutaneous emphysema.  Impression: Aimee Lewis is a 57 year old woman who had a thoracoscopic left upper lobectomy about a week ago for stage IB adenocarcinoma.  She went home on day 4.  Yesterday afternoon she had rapid development of subcutaneous emphysema.  Since then it has stabilized and possibly even regressed slightly.  This occurred after a coughing spell.  There is no question she had an air leak.  It does appear that it has stabilized and per her and her husband's report and  subcutaneous emphysema may have even regressed slightly.  Because of that I think it is reasonable to attempt to manage her without placing another chest tube.  She is not in any distress and denies feeling short of breath.  I advised her that if she develops any progression of the subcutaneous emphysema, worsening chest pain, or shortness of breath that she should call immediately and go to the emergency room.  I will plan to see her back in the office tomorrow with a repeat PA and lateral chest x-ray.  I advised her to avoid any kind of straining or heavy exertion.  She does not need to use incentive spirometer or intentionally cough.  She may use an over-the-counter cough medication if needed.  Plan: Call if any issues arise Return Wednesday 5/22 for PA and lateral chest x-ray  Melrose Nakayama, MD [facility] [phone]

## 2018-02-21 NOTE — ED Triage Notes (Addendum)
Pt has hx of lung cancer. States she noticed swelling starting yesterday. Had an xray done at cardiothoracic surgeon office that showed a pneumothorax. Respirations non labored and 98% on room air. Denies pain to chest or lung.

## 2018-02-21 NOTE — Telephone Encounter (Signed)
Patient called after being seen in the office this morning by Dr. Roxan Hockey.  She stated that after waking up from a nap her neck and face began to swell more than it was at her appointment this morning.  Dr. Roxan Hockey advised her to go to the emergency room where he would evaluate her.  Patient acknowledged receipt.

## 2018-02-21 NOTE — Plan of Care (Signed)
  Problem: Health Behavior/Discharge Planning: Goal: Ability to manage health-related needs will improve Outcome: Progressing   Problem: Clinical Measurements: Goal: Respiratory complications will improve Outcome: Progressing   

## 2018-02-22 ENCOUNTER — Inpatient Hospital Stay (HOSPITAL_COMMUNITY): Payer: BLUE CROSS/BLUE SHIELD

## 2018-02-22 ENCOUNTER — Ambulatory Visit: Payer: BLUE CROSS/BLUE SHIELD | Admitting: Thoracic Surgery (Cardiothoracic Vascular Surgery)

## 2018-02-22 NOTE — Progress Notes (Signed)
Pt ambulated entire unit plus additional 320 feet. Tolerated activity well. Pt now resting in chair. Will continue to monitor.  Aimee Lewis M

## 2018-02-22 NOTE — Progress Notes (Signed)
Pt ambulated entire unit plus additional 320 feet. Tolerated activity well. Pt now resting in bed. Will continue to monitor.  Saori Umholtz M

## 2018-02-22 NOTE — Progress Notes (Signed)
  Subjective: No complaints this AM  Objective: Vital signs in last 24 hours: Temp:  [98.1 F (36.7 C)-98.9 F (37.2 C)] 98.1 F (36.7 C) (05/22 0347) Pulse Rate:  [53-109] 98 (05/22 0347) Cardiac Rhythm: Normal sinus rhythm (05/22 0700) Resp:  [13-20] 13 (05/22 0421) BP: (109-127)/(68-104) 120/77 (05/22 0347) SpO2:  [94 %-100 %] 99 % (05/22 0421) FiO2 (%):  [0 %] 0 % (05/21 2131) Weight:  [122 lb 12.8 oz (55.7 kg)] 122 lb 12.8 oz (55.7 kg) (05/21 1001)  Hemodynamic parameters for last 24 hours:    Intake/Output from previous day: 05/21 0701 - 05/22 0700 In: 240 [P.O.:240] Out: 920 [Urine:800; Chest Tube:120] Intake/Output this shift: No intake/output data recorded.  General appearance: alert, cooperative and no distress Neurologic: intact Heart: regular rate and rhythm Lungs: clear to auscultation bilaterally smal air leak, SQ emphysema a little better  Lab Results: Recent Labs    02/21/18 1623  WBC 4.6  HGB 11.3*  HCT 34.4*  PLT 350   BMET:  Recent Labs    02/21/18 1623  NA 139  K 3.9  CL 101  CO2 28  GLUCOSE 93  BUN 18  CREATININE 0.75  CALCIUM 9.4    PT/INR: No results for input(s): LABPROT, INR in the last 72 hours. ABG    Component Value Date/Time   PHART 7.312 (L) 02/16/2018 0355   HCO3 21.5 02/16/2018 0355   ACIDBASEDEF 3.7 (H) 02/16/2018 0355   O2SAT 99.0 02/16/2018 0355   CBG (last 3)  No results for input(s): GLUCAP in the last 72 hours.  Assessment/Plan: S/P  -Pneumothorax with SQ emphysema Small air leak from tube today- keep on suction for now Lung reexpanded on CXR SQ emphysema still present- down slightly  Regular diet Ambulate Enoxaparin for DVT prophylaxis  Remo Lipps C. Roxan Hockey, MD [facility] [phone]    LOS: 1 day    Melrose Nakayama 02/22/2018

## 2018-02-22 NOTE — Progress Notes (Signed)
Patient asked that PCA pump be turned off. Patient respiration dropped and nurse explained to the patient that is expected when you are on PCA, the pump will alert staff that respiration is dropping. Patient voiced that she wants to sleep and asked nurse to turn it off. Charge nurse also came in to confirm what this wirter said. Tylenol 1000 mg given as ordered.

## 2018-02-23 ENCOUNTER — Inpatient Hospital Stay (HOSPITAL_COMMUNITY): Payer: BLUE CROSS/BLUE SHIELD

## 2018-02-23 MED ORDER — OXYCODONE HCL 5 MG PO TABS
5.0000 mg | ORAL_TABLET | ORAL | Status: DC | PRN
  Administered 2018-02-23 – 2018-02-24 (×5): 5 mg via ORAL
  Administered 2018-02-25 – 2018-02-26 (×3): 10 mg via ORAL
  Administered 2018-02-27: 5 mg via ORAL
  Filled 2018-02-23: qty 2
  Filled 2018-02-23: qty 1
  Filled 2018-02-23 (×3): qty 2
  Filled 2018-02-23 (×5): qty 1

## 2018-02-23 MED ORDER — LACTULOSE 10 GM/15ML PO SOLN
20.0000 g | Freq: Every day | ORAL | Status: DC | PRN
  Administered 2018-02-23: 20 g via ORAL
  Filled 2018-02-23: qty 30

## 2018-02-23 MED ORDER — SENNOSIDES-DOCUSATE SODIUM 8.6-50 MG PO TABS
1.0000 | ORAL_TABLET | Freq: Two times a day (BID) | ORAL | Status: DC
  Administered 2018-02-23 – 2018-02-27 (×7): 1 via ORAL
  Filled 2018-02-23 (×9): qty 1

## 2018-02-23 NOTE — Care Management Note (Signed)
Case Management Note Marvetta Gibbons RN, BSN Unit 4E-Case Manager [phone]  Patient Details  Name: Aimee Lewis MRN: [id number] Date of Birth: 1961-04-12  Subjective/Objective:   Pt admitted with pntx- chest tube placed                 Action/Plan: PTA pt lived at home with spouse- independent- anticipate return home- CM to follow for transition of care needs  Expected Discharge Date:                  Expected Discharge Plan:  Home/Self Care  In-House Referral:  NA  Discharge planning Services  CM Consult  Post Acute Care Choice:    Choice offered to:     DME Arranged:    DME Agency:     HH Arranged:    [facility] Agency:     Status of Service:  In process, will continue to follow  If discussed at Long Length of Stay Meetings, dates discussed:    Discharge Disposition:   Additional Comments:  Dawayne Patricia, RN 02/23/2018, 4:03 PM

## 2018-02-23 NOTE — Progress Notes (Addendum)
      [address]       [place] [address]             [phone]      Subjective:  Patient complains of issues with constipation.  Wants chest tube out.  Objective: Vital signs in last 24 hours: Temp:  [98 F (36.7 C)-98.8 F (37.1 C)] 98 F (36.7 C) (05/23 0303) Pulse Rate:  [73-83] 73 (05/23 0303) Cardiac Rhythm: Normal sinus rhythm (05/22 1954) Resp:  [13-20] 16 (05/23 0357) BP: (108-123)/(67-76) 117/67 (05/23 0303) SpO2:  [96 %-100 %] 97 % (05/23 0357) FiO2 (%):  [98 %-99 %] 99 % (05/22 2008)  Intake/Output from previous day: 05/22 0701 - 05/23 0700 In: 1030 [P.O.:960; I.V.:70] Out: 440 [Urine:350; Chest Tube:90]  General appearance: alert, cooperative and no distress Heart: regular rate and rhythm Lungs: clear to auscultation bilaterally Abdomen: soft, non-tender; bowel sounds normal; no masses,  no organomegaly Extremities: extremities normal, atraumatic, no cyanosis or edema Wound: clean and dry  Lab Results: Recent Labs    02/21/18 1623  WBC 4.6  HGB 11.3*  HCT 34.4*  PLT 350   BMET:  Recent Labs    02/21/18 1623  NA 139  K 3.9  CL 101  CO2 28  GLUCOSE 93  BUN 18  CREATININE 0.75  CALCIUM 9.4    PT/INR: No results for input(s): LABPROT, INR in the last 72 hours. ABG    Component Value Date/Time   PHART 7.312 (L) 02/16/2018 0355   HCO3 21.5 02/16/2018 0355   ACIDBASEDEF 3.7 (H) 02/16/2018 0355   O2SAT 99.0 02/16/2018 0355   CBG (last 3)  No results for input(s): GLUCAP in the last 72 hours.  Assessment/Plan:  1. Chest tube- 1+ air leak with cough, CXR was not ordered, will get portable... Depending on CXR may be able to attempt chest tube to water seal today 2. Pulm- no acute issues, continue IS 3. CV- hemodynamically stable, on home Lisinopril 4. GI- constipation, stool softner ordered, lactulose prn 5. dispo- patient stable, small air leak with cough, if CXR remains stable may be able to transition chest tube to  water seal, continue current care   LOS: 2 days    Ellwood Handler 02/23/2018 Patient seen and examined, agree with above Not using PCA much- will dc, continue Tylenol, oxycodone PRN Small leak, will try on water seal  Remo Lipps C. Roxan Hockey, MD [facility] [phone]

## 2018-02-23 NOTE — Plan of Care (Signed)
  Problem: Coping: Goal: Level of anxiety will decrease Outcome: Progressing   Problem: Safety: Goal: Ability to remain free from injury will improve Outcome: Progressing   Problem: Clinical Measurements: Goal: Ability to maintain clinical measurements within normal limits will improve Outcome: Progressing

## 2018-02-23 NOTE — Discharge Summary (Addendum)
Physician Discharge Summary  Patient ID: Aimee Lewis MRN: [id number] DOB/AGE: 1960-11-29 57 y.o.  Admit date: 02/21/2018 Discharge date: 02/27/2018 Admit date: 02/15/2018 Discharge date: 02/19/2018  Admission Diagnoses: Stage IB (T2N0) non-small cell carcinoma left upper lobe     Patient Active Problem List   Diagnosis Date Noted  . Non-small cell lung cancer, left ([place]) 01/25/2018  . Abnormal CT of the chest 01/18/2018  Hypertension COPD  Discharge Diagnoses: Stage IB (T2N0) non-small cell carcinoma left upper lobe       Patient Active Problem List   Diagnosis Date Noted  . S/P lobectomy of lung 02/15/2018  . Non-small cell lung cancer, left ([place]) 01/25/2018  . Abnormal CT of the chest     Discharged Condition: good  History of Present Illness:  Aimee Lewis is a 57 yo woman with history of tobacco abuse, COPD, pneumonia, and hypertension.  She is known to [facility] having undergone Left Video Assisted Thoracoscopy, Left Upper Lobectomy, Lymph Node Sampling, and Intercostal Nerve Block On 02/15/2018.  Her hospital course progressed without issues.  She initially did okay at discharge.  However she contacted the office on 02/21/2018 with complaints of swelling around her chest tube site, with extension in to her breast and left shoulder.  This developed after an episode of coughing and states it felt funny.  She was evaluated by Dr. Roxan Hockey who obtained a CXR which showed enlargement in previous pneumothorax and subcutaneous emphysema.  It was felt she could be observed and she was instructed to report to the office the next day for a CXR.  However, patient awoke from a nap with worsening swelling at her neck and face.  She was instructed to report to the ED.    Hospital Course:   She was evaluated by Dr. Roxan Hockey who felt admission would be indicated.  She would also require chest tube placement which was performed by Dr. Roxan Hockey.  She tolerated placement without  issue.  Follow up CXR showed resolution of pneumothorax.  She continued to have an air leak present and her chest tube remained on suction for >24 hours.  CXR remained stable and it was felt her chest tube could be transitioned to water seal on 02/23/2018.  Her chest tube continued to show a small pneumothorax.  Her chest tube was free of air leak and her chest tube was removed 02/27/2018.  Follow up CXR remained stable.  She is ambulating without difficulty.  Her pain is well controlled.  She is medically stable for discharge home today.  Treatments: chest tube  Discharge Exam: Blood pressure 109/76, pulse 79, temperature 97.7 F (36.5 C), temperature source Oral, resp. rate 16, height 5' 2.5" (1.588 m), weight 122 lb 12.8 oz (55.7 kg), SpO2 97 %.   General appearance: alert, cooperative and no distress Heart: regular rate and rhythm Lungs: clear to auscultation bilaterally Abdomen: soft, non-tender; bowel sounds normal; no masses,  no organomegaly Extremities: extremities normal, atraumatic, no cyanosis or edema Wound: clean and dry  Disposition: Home  Discharge Medications:  Allergies as of 02/27/2018      Reactions   Codeine Nausea And Vomiting   Darvon [propoxyphene] Nausea And Vomiting      Medication List    TAKE these medications   acetaminophen 500 MG tablet Commonly known as:  TYLENOL Take 2 tablets (1,000 mg total) by mouth every 6 (six) hours.   fluticasone 50 MCG/ACT nasal spray Commonly known as:  FLONASE Place 1 spray into both nostrils daily  as needed for allergies or rhinitis.   Fluticasone-Salmeterol 100-50 MCG/DOSE Aepb Commonly known as:  ADVAIR Inhale 1 puff into the lungs daily as needed (shortness of breath).   ibuprofen 200 MG tablet Commonly known as:  ADVIL,MOTRIN Take 400 mg by mouth daily as needed for headache or moderate pain.   lisinopril 40 MG tablet Commonly known as:  PRINIVIL,ZESTRIL Take 40 mg by mouth daily.   traMADol 50 MG  tablet Commonly known as:  ULTRAM Take 1 tablet (50 mg total) by mouth every 6 (six) hours as needed (mild pain). What changed:  how much to take      Follow-up Information    [facility]  Follow up on 03/06/2018.   Specialty:  Cardiothoracic Surgery Why:  Appointment is at 4:00, please get CXR at 3:30 at [facility] located on first floor of our office building. Contact information: [place], [place] [phone]          Signed: Ellwood Handler 02/27/2018, 8:42 AM

## 2018-02-24 ENCOUNTER — Inpatient Hospital Stay (HOSPITAL_COMMUNITY): Payer: BLUE CROSS/BLUE SHIELD

## 2018-02-24 NOTE — Progress Notes (Addendum)
      [address]       [place] [address]             [phone]      Subjective:  No new complaints.  Constipation resolved  Objective: Vital signs in last 24 hours: Temp:  [97.8 F (36.6 C)-98.6 F (37 C)] 97.8 F (36.6 C) (05/24 0520) Pulse Rate:  [77-120] 81 (05/24 0520) Cardiac Rhythm: Normal sinus rhythm (05/23 2143) Resp:  [18-20] 20 (05/24 0520) BP: (101-122)/(68-98) 106/71 (05/24 0520) SpO2:  [97 %-99 %] 99 % (05/24 0520) Weight:  [122 lb 12.8 oz (55.7 kg)] 122 lb 12.8 oz (55.7 kg) (05/23 2359)  Intake/Output from previous day: 05/23 0701 - 05/24 0700 In: 220 [P.O.:120; I.V.:100] Out: 7 [Chest Tube:50]  General appearance: alert, cooperative and no distress Heart: regular rate and rhythm Lungs: clear to auscultation bilaterally Abdomen: soft, non-tender; bowel sounds normal; no masses,  no organomegaly Extremities: extremities normal, atraumatic, no cyanosis or edema Wound: clean and dry  Lab Results: Recent Labs    02/21/18 1623  WBC 4.6  HGB 11.3*  HCT 34.4*  PLT 350   BMET:  Recent Labs    02/21/18 1623  NA 139  K 3.9  CL 101  CO2 28  GLUCOSE 93  BUN 18  CREATININE 0.75  CALCIUM 9.4    PT/INR: No results for input(s): LABPROT, INR in the last 72 hours. ABG    Component Value Date/Time   PHART 7.312 (L) 02/16/2018 0355   HCO3 21.5 02/16/2018 0355   ACIDBASEDEF 3.7 (H) 02/16/2018 0355   O2SAT 99.0 02/16/2018 0355   CBG (last 3)  No results for input(s): GLUCAP in the last 72 hours.  Assessment/Plan:  1. Chest tube- on water seal, + 1 air leak with cough, CXR remains stable... Leave on water seal today 2. Pulm- no acute issues, CXR with sub q emphysema, no pneumothorax appreciated, continue IS 3. CV- hemodynamically stable on Lisinopril 4. GI- resolved, continue stool softners, laxative as needed 5. Dispo- patient stable, leave chest tube to water seal, continue current care   LOS: 3 days    Ellwood Handler 02/24/2018   Chart reviewed, patient examined, agree with above. Small air leak but CXR looks ok with no change in small left apical ptx. Subcutaneous air unchanged. Continue to water seal and repeat CXR in am.

## 2018-02-24 NOTE — Progress Notes (Signed)
Patient refused ordered Senokot, stating "the nurse gave me a liquid earlier and that did the trick." Patient has had a bowel movement today. Will continue to monitor. Lajoyce Corners, RN

## 2018-02-25 ENCOUNTER — Inpatient Hospital Stay (HOSPITAL_COMMUNITY): Payer: BLUE CROSS/BLUE SHIELD

## 2018-02-25 NOTE — Progress Notes (Addendum)
      [address]       [place] [address]             [phone]       Subjective:  Doing okay.  Sitting on side of bed trying to get HR to "calm down."  She states she was just in the bathroom moving her bowels and her monitor wont stop beeping.  Objective: Vital signs in last 24 hours: Temp:  [97.8 F (36.6 C)-98.1 F (36.7 C)] 98.1 F (36.7 C) (05/25 0518) Pulse Rate:  [87-95] 95 (05/25 0518) Cardiac Rhythm: Sinus tachycardia (05/25 0447) Resp:  [18] 18 (05/24 2122) BP: (92-115)/(61-76) 115/76 (05/25 0518) SpO2:  [98 %-100 %] 100 % (05/25 0518)  Intake/Output from previous day: 05/24 0701 - 05/25 0700 In: 800 [P.O.:800] Out: -   General appearance: alert, cooperative and no distress Heart: regular rate and rhythm Lungs: clear to auscultation bilaterally Abdomen: soft, non-tender; bowel sounds normal; no masses,  no organomegaly Wound: clean and dry  Lab Results: No results for input(s): WBC, HGB, HCT, PLT in the last 72 hours. BMET: No results for input(s): NA, K, CL, CO2, GLUCOSE, BUN, CREATININE, CALCIUM in the last 72 hours.  PT/INR: No results for input(s): LABPROT, INR in the last 72 hours. ABG    Component Value Date/Time   PHART 7.312 (L) 02/16/2018 0355   HCO3 21.5 02/16/2018 0355   ACIDBASEDEF 3.7 (H) 02/16/2018 0355   O2SAT 99.0 02/16/2018 0355   CBG (last 3)  No results for input(s): GLUCAP in the last 72 hours.  Assessment/Plan:  1.Chest tube on water seal, intermittent 1+ air leak with cough- will leave in place today, hopefully air leak will be completely resolved tomorrow and can remove tube in AM 2. Pulm- no acute issues, off oxygen, CXR remains stable 3. CV- Sinus Tach this morning, HTN- continue home Lisinopril 4. Dispo- patient stable, air leak has improved, occurring intermittently this morning, will keep tube in place today, can hopefully remove in AM   LOS: 4 days    Ellwood Handler 02/25/2018   Chart reviewed,  patient examined, agree with above. I don't see any air leak this afternoon and no tidaling. CXR shows small residual ptx at left apex. Continue water seal today and possibly remove tube tomorrow.

## 2018-02-25 NOTE — Plan of Care (Signed)
  Problem: Coping: Goal: Level of anxiety will decrease Outcome: Progressing  Patient anxious about time it takes for her body to heal and length of stay with current chest tube. Reassured her that it is best to allow her body to heal at its own pace for best results.

## 2018-02-26 ENCOUNTER — Inpatient Hospital Stay (HOSPITAL_COMMUNITY): Payer: BLUE CROSS/BLUE SHIELD

## 2018-02-26 NOTE — Plan of Care (Signed)
  Problem: Education: Goal: Knowledge of General Education information will improve Outcome: Progressing   Problem: Clinical Measurements: Goal: Will remain free from infection Outcome: Progressing Goal: Cardiovascular complication will be avoided Outcome: Progressing   Problem: Clinical Measurements: Goal: Cardiovascular complication will be avoided Outcome: Progressing   Problem: Clinical Measurements: Goal: Cardiovascular complication will be avoided Outcome: Progressing   Problem: Pain Managment: Goal: General experience of comfort will improve Outcome: Progressing   Problem: Safety: Goal: Ability to remain free from injury will improve Outcome: Progressing   Problem: Skin Integrity: Goal: Risk for impaired skin integrity will decrease Outcome: Progressing

## 2018-02-26 NOTE — Progress Notes (Addendum)
      [address]       [place] [address]             [phone]      Subjective:  No new complaints.  Already ambulated around the unit.   Objective: Vital signs in last 24 hours: Temp:  [97.6 F (36.4 C)-98.2 F (36.8 C)] 97.6 F (36.4 C) (05/26 0801) Pulse Rate:  [70-81] 70 (05/26 0451) Cardiac Rhythm: Normal sinus rhythm;Heart block (05/25 1900) Resp:  [16-18] 16 (05/26 0451) BP: (94-110)/(65-70) 106/65 (05/26 0803) SpO2:  [96 %-99 %] 99 % (05/26 0801)  Intake/Output from previous day: 05/25 0701 - 05/26 0700 In: 220 [P.O.:220] Out: -   General appearance: alert, cooperative and no distress Heart: regular rate and rhythm Lungs: clear to auscultation bilaterally Abdomen: soft, non-tender; bowel sounds normal; no masses,  no organomegaly Extremities: extremities normal, atraumatic, no cyanosis or edema Wound: clean and dry  Lab Results: No results for input(s): WBC, HGB, HCT, PLT in the last 72 hours. BMET: No results for input(s): NA, K, CL, CO2, GLUCOSE, BUN, CREATININE, CALCIUM in the last 72 hours.  PT/INR: No results for input(s): LABPROT, INR in the last 72 hours. ABG    Component Value Date/Time   PHART 7.312 (L) 02/16/2018 0355   HCO3 21.5 02/16/2018 0355   ACIDBASEDEF 3.7 (H) 02/16/2018 0355   O2SAT 99.0 02/16/2018 0355   CBG (last 3)  No results for input(s): GLUCAP in the last 72 hours.  Assessment/Plan:  1. Chest tube- on water seal, no air leak present, minor tidaling- CXR sub q air looks improved- will d/c chest tube today if okay with Dr. Cyndia Bent 2. Pulm- no acute issues, sub q air somewhat improved, pneumothorax remains stable to improved 3. CV- hemodynamically stable on home Lisinopril 4. Dispo- patient stable, likely d/c chest tube today, repeat CXR In am   LOS: 5 days    Ellwood Handler 02/26/2018   Chart reviewed, patient examined, agree with above. Tiny left apical ptx on CXR and no air leak so tube removed.  Follow up CXR this afternoon shows no change in the tiny left apical ptx on CXR. Will repeat in am and if no change send home.

## 2018-02-26 NOTE — Progress Notes (Signed)
Order received to discontinue chest tube. Patient made aware and procedure explained to patient. Patient given scheduled Tylenol. Patient assisted to bed in lateral lying position to right side. Tape removed. Single suture removed. Occlusive dressing applied to left chest tube removal site. Patient informed to remain in bed for designated time frame. Husband at bedside. Dressing care instructions given to patient and spouse. Patient tolerated well.

## 2018-02-27 ENCOUNTER — Inpatient Hospital Stay (HOSPITAL_COMMUNITY): Payer: BLUE CROSS/BLUE SHIELD

## 2018-02-27 MED ORDER — TRAMADOL HCL 50 MG PO TABS: 50.0000 mg | ORAL_TABLET | Freq: Four times a day (QID) | ORAL | 0 refills | Status: DC | PRN

## 2018-02-27 NOTE — Progress Notes (Signed)
@  Approx. 0630 pt found to be ST 130s-140s assymptomatic. Other VSS. Will convey to day team and continue to monitor.

## 2018-02-27 NOTE — Progress Notes (Signed)
      [address]       [place] [address]             [phone]      Subjective:  No new complaints.  Feels great.  Ambulating independently.  Ready to go home. Objective: Vital signs in last 24 hours: Temp:  [97.7 F (36.5 C)-98.3 F (36.8 C)] 97.7 F (36.5 C) (05/27 0425) Pulse Rate:  [78-99] 79 (05/27 0833) Cardiac Rhythm: Normal sinus rhythm (05/27 0700) Resp:  [15-16] 16 (05/27 0833) BP: (89-109)/(66-76) 109/76 (05/27 0425) SpO2:  [96 %-99 %] 97 % (05/27 0425) FiO2 (%):  [98 %] 98 % (05/27 0833)  Intake/Output from previous day: 05/26 0701 - 05/27 0700 In: 840 [P.O.:840] Out: -   General appearance: alert, cooperative and no distress Heart: regular rate and rhythm Lungs: clear to auscultation bilaterally Abdomen: soft, non-tender; bowel sounds normal; no masses,  no organomegaly Extremities: extremities normal, atraumatic, no cyanosis or edema Wound: clean and dry  Lab Results: No results for input(s): WBC, HGB, HCT, PLT in the last 72 hours. BMET: No results for input(s): NA, K, CL, CO2, GLUCOSE, BUN, CREATININE, CALCIUM in the last 72 hours.  PT/INR: No results for input(s): LABPROT, INR in the last 72 hours. ABG    Component Value Date/Time   PHART 7.312 (L) 02/16/2018 0355   HCO3 21.5 02/16/2018 0355   ACIDBASEDEF 3.7 (H) 02/16/2018 0355   O2SAT 99.0 02/16/2018 0355   CBG (last 3)  No results for input(s): GLUCAP in the last 72 hours.  Assessment/Plan:  1. Pulm- chest tube removed yesterday, CXR is stable in appearance with apical space/pneumothorax 2. CV- remains hemodynamically stable on home Lisinopril 3. Dispo- patient stable, will d/c to home today   LOS: 6 days    Ellwood Handler 02/27/2018

## 2018-02-27 NOTE — Discharge Instructions (Signed)
Discharge Instructions:  1. You may shower, please wash incisions daily with soap and water and keep dry.  If you wish to cover wounds with dressing you may do so but please keep clean and change daily.  No tub baths or swimming until incisions have completely healed.  If your incisions become red or develop any drainage please call our office at [phone]  2. No Driving until cleared by [facility] office and you are no longer using narcotic pain medications  3. Fever of 101.5 for at least 24 hours with no source, please contact our office at [phone]  4. Activity- up as tolerated, please walk at least 3 times per day.  Avoid strenuous activity, no lifting, pushing, or pulling   5. If any questions or concerns arise, please do not hesitate to contact our office at [phone]

## 2018-02-28 ENCOUNTER — Telehealth: Payer: Self-pay

## 2018-02-28 ENCOUNTER — Other Ambulatory Visit: Payer: Self-pay

## 2018-02-28 MED ORDER — TRAMADOL HCL 50 MG PO TABS: 50.0000 mg | ORAL_TABLET | Freq: Four times a day (QID) | ORAL | 0 refills | Status: DC | PRN

## 2018-02-28 NOTE — Telephone Encounter (Signed)
Aimee Lewis called and stated that she is feeling well this morning.  She also called to say that when trying to fill her prescription for Tramadol this weekend, she couldn't.  The prescription was not signed.  Advised her that the office would get her another one.  She acknowledged receipt.

## 2018-03-02 LAB — ACID FAST CULTURE WITH REFLEXED SENSITIVITIES (MYCOBACTERIA): Acid Fast Culture: NEGATIVE

## 2018-03-06 ENCOUNTER — Ambulatory Visit
Admission: RE | Admit: 2018-03-06 | Discharge: 2018-03-06 | Disposition: A | Discharge status: Home / Self Care | Payer: BLUE CROSS/BLUE SHIELD | Source: Ambulatory Visit | Attending: Thoracic Surgery (Cardiothoracic Vascular Surgery) | Admitting: Thoracic Surgery (Cardiothoracic Vascular Surgery)

## 2018-03-06 ENCOUNTER — Ambulatory Visit (INDEPENDENT_AMBULATORY_CARE_PROVIDER_SITE_OTHER): Payer: Self-pay | Admitting: Physician Assistant

## 2018-03-06 ENCOUNTER — Other Ambulatory Visit: Payer: Self-pay

## 2018-03-06 ENCOUNTER — Encounter: Payer: Self-pay | Admitting: Physician Assistant

## 2018-03-06 VITALS — BP 101/72 | HR 77 | Resp 18 | Ht 62.5 in | Wt 119.6 lb

## 2018-03-06 DIAGNOSIS — J939 Pneumothorax, unspecified: Secondary | ICD-10-CM | POA: Diagnosis not present

## 2018-03-06 DIAGNOSIS — C3490 Malignant neoplasm of unspecified part of unspecified bronchus or lung: Secondary | ICD-10-CM

## 2018-03-06 NOTE — Progress Notes (Signed)
  HPI:  Patient returns for follow up as she had a left VATS, thoracoscopic assisted LUL, lymph node biopsy, and intercostal nerve block by Dr. Roxan Hockey on 02/15/2018. She was discharged in stable condition on 02/19/2018. However, she was re admitted on 02/21/2018 as she was found to have a left pneumothorax and increased left subcutaneous emphysema. Dr. Roxan Hockey placed a 14 French pigtail catheter on 05/21. She had re expansion of the lung and was discharged again on 02/27/2018. She states she has noticed some right breast pain when going on longer walks of late. She states "crackily area" on left seems less than when she left the hospital.  Current Outpatient Medications  Medication Sig Dispense Refill  . acetaminophen (TYLENOL) 500 MG tablet Take 2 tablets (1,000 mg total) by mouth every 6 (six) hours. 30 tablet 0  . fluticasone (FLONASE) 50 MCG/ACT nasal spray Place 1 spray into both nostrils daily as needed for allergies or rhinitis.    . Fluticasone-Salmeterol (ADVAIR) 100-50 MCG/DOSE AEPB Inhale 1 puff into the lungs daily as needed (shortness of breath).    Marland Kitchen ibuprofen (ADVIL,MOTRIN) 200 MG tablet Take 400 mg by mouth daily as needed for headache or moderate pain.     Marland Kitchen lisinopril (PRINIVIL,ZESTRIL) 40 MG tablet Take 40 mg by mouth daily.    . traMADol (ULTRAM) 50 MG tablet Take 1 tablet (50 mg total) by mouth every 6 (six) hours as needed (mild pain). 15 tablet 0  Vital signs:BP 101/72, HR 77, RR 18, Oxygenation 98% on room air   Physical Exam: CV- RRR Pulmonary-Cear, on the right,slightly decreased left apex; minor subcutaneous emphysema left anterior and lateral chest wall Wounds-Clean and dry   Diagnostic Tests:  EXAM: CHEST - 2 VIEW  COMPARISON:  CT 01/05/2018, radiograph 02/27/2018  FINDINGS: Normal cardiac silhouette.  Elevation of the LEFT hemidiaphragm.  Interval increase in volume LEFT apical pneumothorax which measures 18 mm from the pleural edge to the  chest wall compared to 7 mm on 02/27/2018. No mediastinal shift.  There is interval decrease in the subcutaneous gas along the LEFT chest wall and neck.  Benign chondroid lesion in the RIGHT humerus  IMPRESSION: 1. Interval increase in volume of small LEFT apical pneumothorax (5-10%). 2. Interval decrease in amount of subcutaneous gas along the LEFT chest wall and bilateral neck soft tissues. 3. Persistent elevation LEFT hemidiaphragm.  These results will be called to the ordering clinician or representative by the Radiologist Assistant, and communication documented in the PACS or zVision Dashboard.   Electronically Signed   By: Suzy Bouchard M.D.   On: 03/06/2018 16:01  Impression and Plan: Although her left pneumothorax appears larger, the subcutaneous emphysema appears less. Her subcutaneous emphysema is mostly on the left anteriorly and laterally. She does not have it in her neck or face. As discussed with Dr. Roxan Hockey, she will return on Wednesday 03/08/2018 and have another chest x ray obtained. Hopefully, left pneumothorax will resolve on its own. Patient was instructed no strenuous activity until after next evaluation.    Nani Skillern, PA-C [facility] [phone]

## 2018-03-06 NOTE — Patient Instructions (Signed)
No strenuous activity until instructed otherwise

## 2018-03-07 ENCOUNTER — Other Ambulatory Visit: Payer: Self-pay | Admitting: Thoracic Surgery (Cardiothoracic Vascular Surgery)

## 2018-03-07 DIAGNOSIS — C3492 Malignant neoplasm of unspecified part of left bronchus or lung: Secondary | ICD-10-CM

## 2018-03-08 ENCOUNTER — Encounter: Payer: Self-pay | Admitting: Thoracic Surgery (Cardiothoracic Vascular Surgery)

## 2018-03-08 ENCOUNTER — Other Ambulatory Visit: Payer: Self-pay

## 2018-03-08 ENCOUNTER — Ambulatory Visit (INDEPENDENT_AMBULATORY_CARE_PROVIDER_SITE_OTHER): Payer: Self-pay | Admitting: Thoracic Surgery (Cardiothoracic Vascular Surgery)

## 2018-03-08 ENCOUNTER — Ambulatory Visit
Admission: RE | Admit: 2018-03-08 | Discharge: 2018-03-08 | Disposition: A | Discharge status: Home / Self Care | Payer: BLUE CROSS/BLUE SHIELD | Source: Ambulatory Visit | Attending: Thoracic Surgery (Cardiothoracic Vascular Surgery) | Admitting: Thoracic Surgery (Cardiothoracic Vascular Surgery)

## 2018-03-08 VITALS — BP 90/58 | HR 75 | Resp 16 | Ht 62.25 in | Wt 119.0 lb

## 2018-03-08 DIAGNOSIS — T797XXD Traumatic subcutaneous emphysema, subsequent encounter: Secondary | ICD-10-CM

## 2018-03-08 DIAGNOSIS — Z902 Acquired absence of lung [part of]: Secondary | ICD-10-CM

## 2018-03-08 DIAGNOSIS — C3492 Malignant neoplasm of unspecified part of left bronchus or lung: Secondary | ICD-10-CM

## 2018-03-08 DIAGNOSIS — T797XXA Traumatic subcutaneous emphysema, initial encounter: Secondary | ICD-10-CM | POA: Insufficient documentation

## 2018-03-08 DIAGNOSIS — J939 Pneumothorax, unspecified: Secondary | ICD-10-CM

## 2018-03-08 DIAGNOSIS — R05 Cough: Secondary | ICD-10-CM | POA: Diagnosis not present

## 2018-03-08 NOTE — Progress Notes (Signed)
[address]       [place] [address]             [phone]     HPI: Aimee Lewis returns for scheduled follow-up visit  Aimee Lewis is a 57 year old woman with a history of tobacco abuse, COPD, and hypertension.  Aimee Lewis developed a respiratory infection in December 2018.  It took about 6 weeks for symptoms to resolve.  A chest Lewis was abnormal.  A CT in February showed a 4.6 x 3.9 x 1.9 cm left upper lobe mass with both solid and sub-solid components.  Follow-up CT at 6 weeks was unchanged.  On PET there was low-level hypermetabolism with an SUV of 2.8.  Dr. Lamonte Sakai did navigational bronchoscopy.  Brushings were positive for non-small cell carcinoma.  I did a thoracoscopic left upper lobectomy on 02/15/2018.  The mass turned out to be a 3.1 cm, T2, N1, stage IB adenocarcinoma.  Aimee Lewis was discharged on postoperative day #4.  Aimee Lewis presented back on 02/21/2018 with subcutaneous emphysema and a small pneumothorax.  A pigtail catheter was placed.  Aimee Lewis resolved, Aimee Lewis tube was removed, and Aimee Lewis was discharged again on 02/25/2018.  Aimee Lewis returned to the office on Monday.  Aimee Lewis but Aimee Lewis showed slight increase in the size of an apical space.  Aimee Lewis symptomatically and was advised to return today for a follow-up chest Lewis.  Aimee Lewis.  Aimee Lewis.  Aimee Lewis is only taking Tylenol for that.  Aimee Lewis is not taking any narcotics.  Aimee Lewis denies any shortness of breath.  Aimee Lewis feels like Aimee Lewis subcutaneous emphysema is getting better.  Past Medical History:  Diagnosis Date  . Chronic bronchitis ([place])   . COPD (chronic obstructive pulmonary disease) ([place])   . Hypertension   . Lung cancer ([place]) dx'd 01/20/2018  . Pneumonia    "at least once" (02/21/2018)  . Pneumothorax 02/21/2018     Current Outpatient Medications  Medication Sig Dispense Refill  . acetaminophen (TYLENOL) 500 MG tablet Take 2 tablets (1,000 mg total)  by mouth every 6 (six) hours. 30 tablet 0  . fluticasone (FLONASE) 50 MCG/ACT nasal spray Place 1 spray into both nostrils daily as needed for allergies or rhinitis.    . Fluticasone-Salmeterol (ADVAIR) 100-50 MCG/DOSE AEPB Inhale 1 puff into the lungs daily as needed (shortness of breath).    Marland Kitchen ibuprofen (ADVIL,MOTRIN) 200 MG tablet Take 400 mg by mouth daily as needed for headache or moderate pain.     Marland Kitchen lisinopril (PRINIVIL,ZESTRIL) 40 MG tablet Take 40 mg by mouth daily.    . traMADol (ULTRAM) 50 MG tablet Take 1 tablet (50 mg total) by mouth every 6 (six) hours as needed (mild pain). (Patient not taking: Reported on 03/08/2018) 15 tablet 0   No current facility-administered medications for this visit.     Physical Exam BP (!) 90/58 (BP Location: Left Arm, Patient Position: Sitting, Cuff Size: Normal)   Pulse 75   Resp 16   Ht 5' 2.25" (1.581 m)   Wt 119 lb (54 kg)   SpO2 97% Comment: ON RA  BMI 21.59 kg/m  57 yo woman in NAD Alert and oriented x 3 Mild SQ emphysema left chest wall Lungs diminished at Left base, o/w clear Cardiac RRR No peripheral edema Incisions clean and dry  Diagnostic Tests: CHEST - 2 VIEW  COMPARISON:  03/06/2018  FINDINGS:  The  heart size and mediastinal contours are within normal limits. Small to moderate left apical pneumothorax shows no significant change in size, estimated at approximately 20%. Subcutaneous emphysema again seen in the left chest wall soft tissues.  Mild atelectasis or scarring in the left lung base shows no change. Right lung is clear. No right-sided pneumothorax. Old fracture deformity of right 6th rib again noted.  IMPRESSION: Stable small to moderate left apical pneumothorax.  Stable mild left basilar atelectasis versus scarring.   Electronically Signed   By: Earle Gell M.D.   On: 03/08/2018 09:32 I personally reviewed the CXR images. Aimee Lewis has a small, stable apical space  Impression:  [name] is a  57 yo woman who underwent a thoracoscopic left upper lobectomy for a stage IB adenocarcinoma 3 weeks ago. Aimee Lewis is doing Lewis at this time.  Aimee Lewis had a small air Lewis initially but it resolved within a couple of days. Aimee Lewis then presented back with a pneumothorax requiring a pigtail catheter placement. The air Lewis again resolved within a couple of days. Aimee Lewis CXR is now stable with a small apical space  Aimee Lewis still has Lewis but is not requiring narcotics.  There are no restrictions on Aimee Lewis physical activity but Aimee Lewis was instructed to build into new activities gradually.  Aimee Lewis may begin driving, appropriate precautions were discussed.  Aimee Lewis may return to work on Monday 03/27/2018  Plan:  Appointment with Dr. Julien Nordmann in [facility] Return in 1 month with PA and lateral CXR  Melrose Nakayama, MD [facility] [phone]

## 2018-03-09 ENCOUNTER — Telehealth: Payer: Self-pay | Admitting: Internal Medicine

## 2018-03-09 NOTE — Telephone Encounter (Signed)
Spoke to patient to invite her to [facility] on 6/13 arrival time of 1230, patient confirmed, letter sent

## 2018-03-13 ENCOUNTER — Ambulatory Visit: Payer: BLUE CROSS/BLUE SHIELD | Admitting: Pulmonary Disease

## 2018-03-14 ENCOUNTER — Ambulatory Visit: Payer: BLUE CROSS/BLUE SHIELD | Admitting: Thoracic Surgery (Cardiothoracic Vascular Surgery)

## 2018-03-14 DIAGNOSIS — H26493 Other secondary cataract, bilateral: Secondary | ICD-10-CM | POA: Diagnosis not present

## 2018-03-14 DIAGNOSIS — Z961 Presence of intraocular lens: Secondary | ICD-10-CM | POA: Diagnosis not present

## 2018-03-15 ENCOUNTER — Other Ambulatory Visit: Payer: Self-pay | Admitting: Medical Oncology

## 2018-03-15 DIAGNOSIS — C3492 Malignant neoplasm of unspecified part of left bronchus or lung: Secondary | ICD-10-CM

## 2018-03-16 ENCOUNTER — Inpatient Hospital Stay: Payer: BLUE CROSS/BLUE SHIELD

## 2018-03-16 ENCOUNTER — Ambulatory Visit: Payer: BLUE CROSS/BLUE SHIELD | Admitting: Physical Therapy

## 2018-03-16 ENCOUNTER — Inpatient Hospital Stay: Payer: BLUE CROSS/BLUE SHIELD | Attending: Internal Medicine | Admitting: Internal Medicine

## 2018-03-16 ENCOUNTER — Encounter: Payer: Self-pay | Admitting: Internal Medicine

## 2018-03-16 VITALS — BP 112/72 | HR 87 | Temp 98.6°F | Resp 18 | Ht 62.25 in | Wt 121.6 lb

## 2018-03-16 DIAGNOSIS — C3492 Malignant neoplasm of unspecified part of left bronchus or lung: Secondary | ICD-10-CM

## 2018-03-16 DIAGNOSIS — C3412 Malignant neoplasm of upper lobe, left bronchus or lung: Secondary | ICD-10-CM | POA: Diagnosis not present

## 2018-03-16 DIAGNOSIS — C3490 Malignant neoplasm of unspecified part of unspecified bronchus or lung: Secondary | ICD-10-CM

## 2018-03-16 LAB — CMP (CANCER CENTER ONLY)
ALT: 11 U/L (ref 0–55)
AST: 18 U/L (ref 5–34)
Albumin: 4.4 g/dL (ref 3.5–5.0)
Alkaline Phosphatase: 65 U/L (ref 40–150)
Anion gap: 10 (ref 3–11)
BUN: 15 mg/dL (ref 7–26)
CO2: 27 mmol/L (ref 22–29)
Calcium: 10.3 mg/dL (ref 8.4–10.4)
Chloride: 101 mmol/L (ref 98–109)
Creatinine: 0.82 mg/dL (ref 0.60–1.10)
GFR, Est AFR Am: >60 mL/min (ref >60)
GFR, Estimated: >60 mL/min (ref >60)
Glucose, Bld: 81 mg/dL (ref 70–140)
Potassium: 4.1 mmol/L (ref 3.5–5.1)
Sodium: 138 mmol/L (ref 136–145)
Total Bilirubin: 0.3 mg/dL (ref 0.2–1.2)
Total Protein: 7.7 g/dL (ref 6.4–8.3)

## 2018-03-16 LAB — CBC WITH DIFFERENTIAL (CANCER CENTER ONLY)
Basophils Absolute: 0.1 10*3/uL (ref 0.0–0.1)
Basophils Relative: 1 %
Eosinophils Absolute: 0.5 10*3/uL (ref 0.0–0.5)
Eosinophils Relative: 9 %
HCT: 35.9 % (ref 34.8–46.6)
Hemoglobin: 11.7 g/dL (ref 11.6–15.9)
Lymphocytes Relative: 37 %
Lymphs Abs: 1.9 10*3/uL (ref 0.9–3.3)
MCH: 32.2 pg (ref 25.1–34.0)
MCHC: 32.6 g/dL (ref 31.5–36.0)
MCV: 98.9 fL (ref 79.5–101.0)
Monocytes Absolute: 0.4 10*3/uL (ref 0.1–0.9)
Monocytes Relative: 8 %
Neutro Abs: 2.4 10*3/uL (ref 1.5–6.5)
Neutrophils Relative %: 45 %
Platelet Count: 301 10*3/uL (ref 145–400)
RBC: 3.63 MIL/uL — ABNORMAL LOW (ref 3.70–5.45)
RDW: 13.9 % (ref 11.2–14.5)
WBC Count: 5.3 10*3/uL (ref 3.9–10.3)

## 2018-03-16 NOTE — Progress Notes (Signed)
Aimee [phone]) [phone]   Fax:([phone]) [phone] Multidisciplinary thoracic oncology clinic  CONSULT NOTE  REFERRING PHYSICIAN: Dr. Modesto Charon  REASON FOR CONSULTATION:  57 years old white female recently diagnosed with lung cancer.  HPI Aimee Lewis is a 57 y.o. female with past medical history significant for hypertension, COPD, bronchitis as well as long history for smoking quit Feb 14, 2018.  The patient mentioned that she has been complaining of cough and mild shortness of breath since December 2018.  She was treated with several courses of antibiotics with no improvement.  Chest x-ray was performed and it showed left upper lobe opacity.  This was followed by CT scan of the chest and 11/22/2017 and it showed 4.6 x 1.9 x 5.9 cm left upper lobe masslike opacity.  The patient then had a PET scan on December 26, 2017 and that showed the previously noted masslike mixed solid and sub-solid lesion in the left upper lobe measured 4.6 x 2.1 cm and demonstrates low-level hypermetabolism.  No other suspicious appearing pulmonary nodules or masses are noted.  There was also no evidence of distant metastatic disease.  She underwent bronchoscopy with electromagnetic navigation bronchoscopy under the care of Dr. Lamonte Sakai on 01/18/2018 and the final cytology was suspicious for malignancy.  MRI of the brain on January 26, 2018 was negative for malignancy.  The patient was referred to Dr. Roxan Hockey. On Feb 15, 2018 she underwent left VATS, left upper lobectomy with mediastinal lymph node sampling under the care of Dr. Roxan Hockey.  The final pathology ([id number]) showed adenocarcinoma moderately differentiated a spanning 3.1 cm.  The resection margins were negative for malignancy and the dissected lymph nodes were also negative for malignancy. The patient is recovering well from her surgery. Dr. Roxan Hockey kindly referred the patient to the multidisciplinary thoracic oncology  clinic today for evaluation and discussion of adjuvant treatment options. When seen today she is feeling fine except for soreness under her left breast from the surgical scar.  She continues to have mild cough but no significant shortness of breath or hemoptysis.  She denied having any recent weight loss or night sweats.  She has no nausea, vomiting, diarrhea or constipation. Family history significant for father with lung cancer at age 32, mother had heart disease and brother had throat cancer. The patient is married and has 2 children his son and daughter.  She was accompanied today by her husband, Aimee Lewis.  She works at [employer] in [place]. She has a history for smoking 1 pack/day for around 38 years and quit Feb 14, 2018.  She also drinks wine few times a week.  No history of drug abuse.  HPI  Past Medical History:  Diagnosis Date  . Chronic bronchitis ([place])   . COPD (chronic obstructive pulmonary disease) ([place])   . Hypertension   . Lung cancer ([place]) dx'd 01/20/2018  . Pneumonia    "at least once" (02/21/2018)  . Pneumothorax 02/21/2018    Past Surgical History:  Procedure Laterality Date  . BACK SURGERY    . CATARACT EXTRACTION W/ INTRAOCULAR LENS  IMPLANT, BILATERAL Bilateral 09/2017  . CHEST TUBE INSERTION Left 02/15/2018; 02/21/2018  . LUMBAR DISC SURGERY    . TONSILLECTOMY    . VIDEO ASSISTED THORACOSCOPY (VATS)/ LOBECTOMY Left 02/15/2018   Procedure: VIDEO ASSISTED THORACOSCOPY (VATS)/LEFT UPPER LOBECTOMY;  Surgeon: Melrose Nakayama, MD;  Location: [facility];  Service: Thoracic;  Laterality: Left;  Marland Kitchen VIDEO BRONCHOSCOPY WITH ENDOBRONCHIAL  NAVIGATION N/A 01/18/2018   Procedure: VIDEO BRONCHOSCOPY WITH ENDOBRONCHIAL NAVIGATION WITH BIOPSIES;  Surgeon: Collene Gobble, MD;  Location: [facility];  Service: Thoracic;  Laterality: N/A;    No family history on file.  Social History Social History   Tobacco Use  . Smoking status: Former Smoker    Packs/day: 1.00     Years: 38.00    Pack years: 38.00    Types: Cigarettes    Last attempt to quit: 02/12/2018    Years since quitting: 0.0  . Smokeless tobacco: Never Used  Substance Use Topics  . Alcohol use: Yes    Alcohol/week: 1.8 oz    Types: 3 Glasses of wine per week  . Drug use: No    Allergies  Allergen Reactions  . Codeine Nausea And Vomiting  . Darvon [Propoxyphene] Nausea And Vomiting    Current Outpatient Medications  Medication Sig Dispense Refill  . acetaminophen (TYLENOL) 500 MG tablet Take 2 tablets (1,000 mg total) by mouth every 6 (six) hours. 30 tablet 0  . fluticasone (FLONASE) 50 MCG/ACT nasal spray Place 1 spray into both nostrils daily as needed for allergies or rhinitis.    . Fluticasone-Salmeterol (ADVAIR) 100-50 MCG/DOSE AEPB Inhale 1 puff into the lungs daily as needed (shortness of breath).    Marland Kitchen ibuprofen (ADVIL,MOTRIN) 200 MG tablet Take 400 mg by mouth daily as needed for headache or moderate pain.     Marland Kitchen lisinopril (PRINIVIL,ZESTRIL) 40 MG tablet Take 40 mg by mouth daily.    . traMADol (ULTRAM) 50 MG tablet Take 1 tablet (50 mg total) by mouth every 6 (six) hours as needed (mild pain). (Patient not taking: Reported on 03/08/2018) 15 tablet 0   No current facility-administered medications for this visit.     Review of Systems  Constitutional: negative Eyes: negative Ears, nose, mouth, throat, and face: negative Respiratory: positive for cough Cardiovascular: negative Gastrointestinal: negative Genitourinary:negative Integument/breast: negative Hematologic/lymphatic: negative Musculoskeletal:negative Neurological: negative Behavioral/Psych: negative Endocrine: negative Allergic/Immunologic: negative  Physical Exam  WUJ:WJXBJ, healthy, no distress, well nourished and well developed SKIN: skin color, texture, turgor are normal, no rashes or significant lesions HEAD: Normocephalic, No masses, lesions, tenderness or abnormalities EYES: normal, PERRLA,  Conjunctiva are pink and non-injected EARS: External ears normal, Canals clear OROPHARYNX:no exudate, no erythema and lips, buccal mucosa, and tongue normal  NECK: supple, no adenopathy, no JVD LYMPH:  no palpable lymphadenopathy, no hepatosplenomegaly BREAST:not examined LUNGS: clear to auscultation , and palpation HEART: regular rate & rhythm, no murmurs and no gallops ABDOMEN:abdomen soft, non-tender, normal bowel sounds and no masses or organomegaly BACK: Back symmetric, no curvature., No CVA tenderness EXTREMITIES:no joint deformities, effusion, or inflammation, no edema  NEURO: alert & oriented x 3 with fluent speech, no focal motor/sensory deficits  PERFORMANCE STATUS: ECOG 0  LABORATORY DATA: Lab Results  Component Value Date   WBC 5.3 03/16/2018   HGB 11.7 03/16/2018   HCT 35.9 03/16/2018   MCV 98.9 03/16/2018   PLT 301 03/16/2018      Chemistry      Component Value Date/Time   NA 139 02/21/2018 1623   K 3.9 02/21/2018 1623   CL 101 02/21/2018 1623   CO2 28 02/21/2018 1623   BUN 18 02/21/2018 1623   CREATININE 0.75 02/21/2018 1623      Component Value Date/Time   CALCIUM 9.4 02/21/2018 1623   ALKPHOS 37 (L) 02/16/2018 0745   AST 22 02/16/2018 0745   ALT 11 (L) 02/16/2018 0745  BILITOT 0.5 02/16/2018 0745       RADIOGRAPHIC STUDIES: Dg Chest 2 View  Result Date: 03/08/2018 CLINICAL DATA:  Follow-up left pneumothorax. Nonproductive cough and shortness of breath. Three weeks status post left upper lobectomy. EXAM: CHEST - 2 VIEW COMPARISON:  03/06/2018 FINDINGS: The heart size and mediastinal contours are within normal limits. Small to moderate left apical pneumothorax shows no significant change in size, estimated at approximately 20%. Subcutaneous emphysema again seen in the left chest wall soft tissues. Mild atelectasis or scarring in the left lung base shows no change. Right lung is clear. No right-sided pneumothorax. Old fracture deformity of right 6th rib  again noted. IMPRESSION: Stable small to moderate left apical pneumothorax. Stable mild left basilar atelectasis versus scarring. Electronically Signed   By: Earle Gell M.D.   On: 03/08/2018 09:32   Dg Chest 2 View  Result Date: 03/06/2018 CLINICAL DATA:  Post the ats EXAM: CHEST - 2 VIEW COMPARISON:  CT 01/05/2018, radiograph 02/27/2018 FINDINGS: Normal cardiac silhouette.  Elevation of the LEFT hemidiaphragm. Interval increase in volume LEFT apical pneumothorax which measures 18 mm from the pleural edge to the chest wall compared to 7 mm on 02/27/2018. No mediastinal shift. There is interval decrease in the subcutaneous gas along the LEFT chest wall and neck. Benign chondroid lesion in the RIGHT humerus IMPRESSION: 1. Interval increase in volume of small LEFT apical pneumothorax (5-10%). 2. Interval decrease in amount of subcutaneous gas along the LEFT chest wall and bilateral neck soft tissues. 3. Persistent elevation LEFT hemidiaphragm. These results will be called to the ordering clinician or representative by the Radiologist Assistant, and communication documented in the PACS or zVision Dashboard. Electronically Signed   By: Suzy Bouchard M.D.   On: 03/06/2018 16:01   Dg Chest 2 View  Result Date: 02/27/2018 CLINICAL DATA:  Pneumothorax.  Chest tube removal. EXAM: CHEST - 2 VIEW COMPARISON:  02/26/2018 and older exams. FINDINGS: Small left apical pneumothorax is without change from the previous day's study. Extensive subcutaneous emphysema at the neck base and across the left chest is also unchanged. Mild scarring at the left lung base, stable. No evidence of pneumonia or pulmonary edema. No right pneumothorax. IMPRESSION: 1. Small left apical pneumothorax is stable from the previous day's study. 2. Fairly extensive subcutaneous emphysema is unchanged from the previous day's study. 3. No new abnormalities. No evidence of pneumonia or pulmonary edema. Electronically Signed   By: Lajean Manes M.D.    On: 02/27/2018 08:11   Dg Chest 2 View  Result Date: 02/21/2018 CLINICAL DATA:  Status post VATS and left upper lobectomy 02/15/2018. Left chest pain and shortness of breath. EXAM: CHEST - 2 VIEW COMPARISON:  PA and lateral chest 02/19/2017 and 02/18/2018. FINDINGS: Right IJ catheter has been removed since the most recent examination. Subcutaneous emphysema over the left chest has markedly increased since the most recent exam and there is new pneumomediastinum. Left pneumothorax has increased in size and is estimated at 30-35%. There is a small left pleural effusion. The right lung is clear. Heart size is normal. Remote right rib fracture and scoliosis noted. IMPRESSION: Increase in the size of a left pneumothorax now estimated 30-35%. Marked increase in subcutaneous emphysema and new pneumomediastinum. Critical Value/emergent results were called by Lewis at the time of interpretation on 02/21/2018 at 9:58 am to [facility], RN , who verbally acknowledged these results. Electronically Signed   By: Inge Rise M.D.   On: 02/21/2018 10:03   Dg  Chest 2 View  Result Date: 02/19/2018 CLINICAL DATA:  Postop LEFT upper lobectomy EXAM: CHEST - 2 VIEW COMPARISON:  02/18/2018 FINDINGS: Removal of LEFT chest tube with slight increase in volume of small LEFT apical pneumothorax. The pleural edge measures 8 mm from the upper chest wall compared to 5 mm. Persistent elevation of LEFT hemidiaphragm. Subcutaneous gas along the LEFT chest wall unchanged. RIGHT lung is clear.  Central venous line unchanged. IMPRESSION: 1. Slight increase in volume of LEFT apical pneumothorax following chest tube removal. 2. Persistent elevation of the LEFT hemidiaphragm. Electronically Signed   By: Suzy Bouchard M.D.   On: 02/19/2018 08:49   Dg Chest Port 1 View  Result Date: 02/26/2018 CLINICAL DATA:  Left chest tube removed. EXAM: PORTABLE CHEST 1 VIEW COMPARISON:  Earlier today. FINDINGS: The left chest tube has been removed.  There has been no significant change in the previously demonstrated small left apical pneumothorax. There has also been no significant change in bilateral subcutaneous emphysema and elevation of the left hemidiaphragm. The heart remains normal in size. The lungs are clear. Left medial and basilar surgical staples are again demonstrated as well as old, healed right rib fractures. Mild to moderate scoliosis is unchanged. IMPRESSION: 1. Stable 5% left apical pneumothorax following left chest tube removal. 2. Stable bilateral subcutaneous emphysema and left postsurgical changes with elevation of the left hemidiaphragm. Electronically Signed   By: Claudie Revering M.D.   On: 02/26/2018 15:22   Dg Chest Port 1 View  Result Date: 02/26/2018 CLINICAL DATA:  Follow-up pneumothorax. EXAM: PORTABLE CHEST 1 VIEW COMPARISON:  02/25/2018. FINDINGS: Left chest tube is in place. The small left apical pneumothorax is again noted and appears slightly decreased in volume from previous exam. Again noted is extensive left chest wall subcutaneous emphysema extending into bilateral supraclavicular regions. IMPRESSION: 1. Left chest tube in place with decrease in size of small left apical pneumothorax. Electronically Signed   By: Kerby Moors M.D.   On: 02/26/2018 08:44   Dg Chest Port 1 View  Result Date: 02/25/2018 CLINICAL DATA:  Pneumothorax EXAM: PORTABLE CHEST - 1 VIEW COMPARISON:  the previous day's study FINDINGS: Pigtail left chest tube remains directed towards the left lung apex. Slight improvement in small apical pneumothorax. Extensive left lateral chest and bilateral neck subcutaneous emphysema as before. Lungs are clear. Stable elevation of left diaphragmatic leaflet. Postop changes at the left hilum. Heart size and mediastinal contours are within normal limits. Aortic Atherosclerosis (ICD10-170.0) No effusion. Old healed right rib fractures. IMPRESSION: 1. Slight decrease in small left apical pneumothorax, with stable  chest tube position. Electronically Signed   By: Lucrezia Europe M.D.   On: 02/25/2018 09:01   Dg Chest Port 1 View  Result Date: 02/24/2018 CLINICAL DATA:  Follow-up left pneumothorax EXAM: PORTABLE CHEST 1 VIEW COMPARISON:  02/23/2018 FINDINGS: Cardiac shadow is stable. Left chest tube is again identified and stable. The overall appearance of the left pneumothorax is stable as well given some patient positioning differences. The right lung remains clear. Old rib fractures are noted as well as significant subcutaneous emphysema. IMPRESSION: Relative stable appearance of the left pneumothorax given some patient position differences. Electronically Signed   By: Inez Catalina M.D.   On: 02/24/2018 07:37   Dg Chest Port 1 View  Result Date: 02/23/2018 CLINICAL DATA:  Post left upper lobectomy, follow-up, hoarseness EXAM: PORTABLE CHEST 1 VIEW COMPARISON:  Interval chest x-ray of 02/22/2017 FINDINGS: There is still a small left apical pneumothorax  with left chest tube remaining. Left chest wall subcutaneous air has not changed significantly. The left hemidiaphragm remains elevated. The right lung is clear. Heart size is stable. IMPRESSION: Small left pneumothorax appears unchanged with left chest tube remaining. Left chest wall subcutaneous air is unchanged. Electronically Signed   By: Ivar Drape M.D.   On: 02/23/2018 09:54   Dg Chest Port 1 View  Result Date: 02/22/2018 CLINICAL DATA:  Follow-up pneumothorax EXAM: PORTABLE CHEST 1 VIEW COMPARISON:  02/21/2018 FINDINGS: Pigtail chest tube is again identified on the left. The overall appearance of the catheter is stable. The previously seen left apical pneumothorax has resolved in the interval. Lungs are otherwise clear. Considerable subcutaneous emphysema is noted on the left with volume loss in the left lung. Cardiac shadow is stable. No acute bony abnormality is noted. IMPRESSION: Resolution of left-sided pneumothorax. Chest tube remains in place on the left.  Electronically Signed   By: Inez Catalina M.D.   On: 02/22/2018 07:49   Dg Chest Portable 1 View  Result Date: 02/21/2018 CLINICAL DATA:  Lung cancer.  Chest tube placement. EXAM: PORTABLE CHEST 1 VIEW COMPARISON:  02/21/2018 FINDINGS: Interval placement of left chest tube. Increasing subcutaneous emphysema throughout the left chest wall, and neck bilaterally. Small residual left apical pneumothorax, decreased in size since prior study, less than 5%. Elevation of the left hemidiaphragm. Postoperative changes on the left. Right lung clear. Heart is normal size. IMPRESSION: Interval placement of left chest tube with decreasing size of the left pneumothorax, now less than 5%. Increasing subcutaneous emphysema throughout the left chest wall and neck bilaterally. Electronically Signed   By: Rolm Baptise M.D.   On: 02/21/2018 18:05   Dg Chest Port 1 View  Result Date: 02/18/2018 CLINICAL DATA:  Status post lobectomy EXAM: PORTABLE CHEST 1 VIEW COMPARISON:  Yesterday FINDINGS: Small left apical pneumothorax, less than 10%. No convincing change from yesterday. Left-sided chest tube with tip at the apex is unchanged. Right IJ line with tip at the upper cavoatrial junction. Volume loss and mild atelectasis on the left. Stable chest wall emphysema on the left. Normal heart size. IMPRESSION: Stable postoperative chest including small left apical pneumothorax. Electronically Signed   By: Monte Fantasia M.D.   On: 02/18/2018 08:43   Dg Chest Port 1 View  Result Date: 02/17/2018 CLINICAL DATA:  Status post left lobectomy. EXAM: PORTABLE CHEST 1 VIEW COMPARISON:  Feb 16, 2018 FINDINGS: A left chest tube remains in place. The left-sided pneumothorax measures 10 mm in thickness at the lateral apex today versus 15 mm yesterday. The pneumothorax is slightly smaller. Subcutaneous air remains in the left chest wall. The right central line is stable terminating in the central SVC. The cardiomediastinal silhouette is unchanged.  Mild atelectasis at the left base. The lungs are otherwise clear. Healed right rib fracture. IMPRESSION: 1. Stable left chest tube. The left-sided pneumothorax is slightly smaller in the interval. Recommend continued attention on follow-up. No other changes. Electronically Signed   By: Dorise Bullion III M.D   On: 02/17/2018 07:35   Dg Chest Port 1 View  Result Date: 02/16/2018 CLINICAL DATA:  57 year old female postoperative day 1 status post left upper lobectomy and lymph node sampling for left upper lobe non-small cell carcinoma. EXAM: PORTABLE CHEST 1 VIEW COMPARISON:  02/15/2018 and earlier. FINDINGS: Portable AP semi upright view at 0630 hours. Left chest tube remains in place. Stable small left apical pneumothorax and stable to decreased mild left chest wall subcutaneous emphysema.  Surgical clips and staple lines along the left mediastinum again noted. Stable right IJ central line. Stable cardiac size and mediastinal contours. Allowing for portable technique the lungs are clear. Benign appearing proximal right humerus metadiaphysis bone lesion, probable enchondroma, re-demonstrated. Chronic right lateral 5th and 6th rib fractures. IMPRESSION: 1. Post left upper lobectomy. Stable lines and tubes. Stable small left pneumothorax. 2. No other acute cardiopulmonary abnormality. Electronically Signed   By: Genevie Ann M.D.   On: 02/16/2018 10:07   Dg Chest Port 1 View  Result Date: 02/15/2018 CLINICAL DATA:  Left lung surgery. EXAM: PORTABLE CHEST 1 VIEW COMPARISON:  02/13/2018 and CT chest 01/05/2018. FINDINGS: Trachea is midline. Right IJ central line tip projects over the SVC. Heart size normal. Left chest tube terminates in the apex of the left hemithorax. Postoperative changes in the left hemithorax with mild left basilar volume loss. Small left apical pneumothorax. Difficult to exclude a tiny amount of left pleural fluid. Subcutaneous emphysema along the left chest wall. Right lung is clear. Old right  rib fractures. Bone infarct or enchondroma in the proximal right humerus. IMPRESSION: 1. Small left pneumothorax with left chest tube in place. 2. Postoperative changes in the left hemithorax with basilar volume loss and subcutaneous emphysema along the left chest wall. 3. Difficult to exclude a tiny left pleural effusion. Electronically Signed   By: Lorin Picket M.D.   On: 02/15/2018 16:48    ASSESSMENT: This is a very pleasant 57 years old white female recently diagnosed with a stage Ib (T2 a, N0, M0) non-small cell lung cancer, moderately differentiated adenocarcinoma status post left upper lobectomy with lymph node sampling on 02/15/2018 under the care of Dr. Roxan Hockey.  PLAN: I had a lengthy discussion with the patient today about her current disease of stage, prognosis and treatment options.  I explained to the patient that there is no survival benefit for adjuvant systemic chemotherapy for patient with a stage Ib if the tumor size is less than 4.0 cm. I also explained to the patient that the 5-year survival for patient with a stage Ib is around 60%. I recommended for the patient to continue on observation with close monitoring and repeat CT scan of the chest in 6 months. I strongly advised her to continue quitting smoking. She will come back for follow-up visit in 6 months with repeat CT scan of the chest. She was advised to call immediately if she has any concerning symptoms in the interval. The patient voices understanding of current disease status and treatment options and is in agreement with the current care plan.  All questions were answered. The patient knows to call the clinic with any problems, questions or concerns. We can certainly see the patient much sooner if necessary.  Thank you so much for allowing me to participate in the care of Aimee Lewis. I will continue to follow up the patient with you and assist in her care.  I spent 40 minutes counseling the patient face  to face. The total time spent in the appointment was 60 minutes.  Disclaimer: This note was dictated with voice recognition software. Similar sounding words can inadvertently be transcribed and may not be corrected upon review.   Eilleen Kempf March 16, 2018, 1:06 PM

## 2018-03-17 ENCOUNTER — Telehealth: Payer: Self-pay

## 2018-03-17 NOTE — Telephone Encounter (Signed)
Aimee Lewis called and left a VM needing approval from her dentist/ Periodontal office to have a teeth cleaning/ examination related to an irritation from her gums.  She is s/p VATS/ left upper lobectomy  02/15/2018 with Dr. Roxan Hockey.  I advised here that there is not waiting period or special instructions for dental services after this type of surgery.  All questions were answered.

## 2018-03-22 ENCOUNTER — Telehealth: Payer: Self-pay | Admitting: *Deleted

## 2018-03-22 NOTE — Telephone Encounter (Signed)
Oncology Nurse Navigator Documentation  Oncology Nurse Navigator Flowsheets 03/22/2018  Navigator Location [facility]  Navigator Encounter Type Telephone/I called to follow up with Aimee Lewis.  She states she is doing well.  I asked about her smoking and she quit since surgery.  I listened as she explained. I encouraged her to continue not smoking.  I updated her on pulmonary rehab but she states she is active and walking several miles a day.  I again encourage her to continue.  I asked that she call me if needed.   Telephone Outgoing Call  Abnormal Finding Date 11/22/2017  Confirmed Diagnosis Date 01/18/2018  Surgery Date 02/15/2018  Multidisiplinary Clinic Date 03/16/2018  Treatment Initiated Date 02/15/2018  Treatment Phase Other  Barriers/Navigation Needs Education  Education Smoking cessation;Other  Interventions Education  Education Method Verbal  Acuity Level 1  Acuity Level 1 Minimal follow up required  Time Spent with Patient 30

## 2018-04-05 ENCOUNTER — Telehealth: Payer: Self-pay
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
## 2018-04-10 ENCOUNTER — Other Ambulatory Visit: Payer: Self-pay | Admitting: *Deleted

## 2018-04-10 DIAGNOSIS — Z902 Acquired absence of lung [part of]: Secondary | ICD-10-CM

## 2018-04-11 ENCOUNTER — Other Ambulatory Visit: Payer: Self-pay

## 2018-04-11 ENCOUNTER — Ambulatory Visit (INDEPENDENT_AMBULATORY_CARE_PROVIDER_SITE_OTHER): Payer: Self-pay | Admitting: Thoracic Surgery (Cardiothoracic Vascular Surgery)

## 2018-04-11 ENCOUNTER — Ambulatory Visit
Admission: RE | Admit: 2018-04-11 | Discharge: 2018-04-11 | Disposition: A | Discharge status: Home / Self Care | Payer: BLUE CROSS/BLUE SHIELD | Source: Ambulatory Visit | Attending: Thoracic Surgery (Cardiothoracic Vascular Surgery) | Admitting: Thoracic Surgery (Cardiothoracic Vascular Surgery)

## 2018-04-11 ENCOUNTER — Encounter: Payer: Self-pay | Admitting: Thoracic Surgery (Cardiothoracic Vascular Surgery)

## 2018-04-11 VITALS — BP 104/73 | HR 84 | Resp 16 | Ht 62.25 in | Wt 122.6 lb

## 2018-04-11 DIAGNOSIS — Z902 Acquired absence of lung [part of]: Secondary | ICD-10-CM

## 2018-04-11 DIAGNOSIS — J930 Spontaneous tension pneumothorax: Secondary | ICD-10-CM

## 2018-04-11 DIAGNOSIS — C3492 Malignant neoplasm of unspecified part of left bronchus or lung: Secondary | ICD-10-CM

## 2018-04-11 DIAGNOSIS — J939 Pneumothorax, unspecified: Secondary | ICD-10-CM | POA: Diagnosis not present
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
## 2018-04-24 DIAGNOSIS — Z23 Encounter for immunization: Secondary | ICD-10-CM | POA: Diagnosis not present

## 2018-04-24 DIAGNOSIS — R7301 Impaired fasting glucose: Secondary | ICD-10-CM | POA: Diagnosis not present

## 2018-04-24 DIAGNOSIS — J449 Chronic obstructive pulmonary disease, unspecified: Secondary | ICD-10-CM | POA: Diagnosis not present

## 2018-04-24 DIAGNOSIS — I10 Essential (primary) hypertension: Secondary | ICD-10-CM | POA: Diagnosis not present

## 2018-04-24 DIAGNOSIS — C3492 Malignant neoplasm of unspecified part of left bronchus or lung: Secondary | ICD-10-CM | POA: Diagnosis not present

## 2018-04-24 DIAGNOSIS — E785 Hyperlipidemia, unspecified: Secondary | ICD-10-CM | POA: Diagnosis not present

## 2018-04-24 DIAGNOSIS — Z0000 Encounter for general adult medical examination without abnormal findings: Secondary | ICD-10-CM | POA: Diagnosis not present

## 2018-09-12 ENCOUNTER — Ambulatory Visit (HOSPITAL_COMMUNITY)
Admission: RE | Admit: 2018-09-12 | Discharge: 2018-09-12 | Disposition: A | Discharge status: Home / Self Care | Payer: BLUE CROSS/BLUE SHIELD | Source: Ambulatory Visit | Attending: Internal Medicine | Admitting: Internal Medicine

## 2018-09-12 ENCOUNTER — Encounter (HOSPITAL_COMMUNITY): Payer: Self-pay

## 2018-09-12 ENCOUNTER — Inpatient Hospital Stay: Payer: BLUE CROSS/BLUE SHIELD | Attending: Internal Medicine

## 2018-09-12 DIAGNOSIS — C3490 Malignant neoplasm of unspecified part of unspecified bronchus or lung: Secondary | ICD-10-CM

## 2018-09-12 DIAGNOSIS — E875 Hyperkalemia: Secondary | ICD-10-CM | POA: Insufficient documentation

## 2018-09-12 DIAGNOSIS — Z85118 Personal history of other malignant neoplasm of bronchus and lung: Secondary | ICD-10-CM | POA: Diagnosis not present

## 2018-09-12 LAB — CBC WITH DIFFERENTIAL (CANCER CENTER ONLY)
Abs Immature Granulocytes: 0.04 10*3/uL (ref 0.00–0.07)
Basophils Absolute: 0 10*3/uL (ref 0.0–0.1)
Basophils Relative: 1 %
Eosinophils Absolute: 0.3 10*3/uL (ref 0.0–0.5)
Eosinophils Relative: 5 %
HCT: 33.9 % — ABNORMAL LOW (ref 36.0–46.0)
Hemoglobin: 11.2 g/dL — ABNORMAL LOW (ref 12.0–15.0)
Immature Granulocytes: 1 %
Lymphocytes Relative: 16 %
Lymphs Abs: 1 10*3/uL (ref 0.7–4.0)
MCH: 32.3 pg (ref 26.0–34.0)
MCHC: 33 g/dL (ref 30.0–36.0)
MCV: 97.7 fL (ref 80.0–100.0)
Monocytes Absolute: 0.7 10*3/uL (ref 0.1–1.0)
Monocytes Relative: 11 %
Neutro Abs: 4.1 10*3/uL (ref 1.7–7.7)
Neutrophils Relative %: 66 %
Platelet Count: 379 10*3/uL (ref 150–400)
RBC: 3.47 MIL/uL — ABNORMAL LOW (ref 3.87–5.11)
RDW: 12.2 % (ref 11.5–15.5)
WBC Count: 6.2 10*3/uL (ref 4.0–10.5)
nRBC: 0 % (ref 0.0–0.2)

## 2018-09-12 LAB — CMP (CANCER CENTER ONLY)
ALT: 9 U/L (ref 0–44)
AST: 19 U/L (ref 15–41)
Albumin: 3.9 g/dL (ref 3.5–5.0)
Alkaline Phosphatase: 83 U/L (ref 38–126)
Anion gap: 12 (ref 5–15)
BUN: 16 mg/dL (ref 6–20)
CO2: 25 mmol/L (ref 22–32)
Calcium: 9.7 mg/dL (ref 8.9–10.3)
Chloride: 97 mmol/L — ABNORMAL LOW (ref 98–111)
Creatinine: 1.1 mg/dL — ABNORMAL HIGH (ref 0.44–1.00)
GFR, Est AFR Am: >60 mL/min (ref >60)
GFR, Estimated: 55 mL/min — ABNORMAL LOW (ref >60)
Glucose, Bld: 98 mg/dL (ref 70–99)
Potassium: 5.3 mmol/L — ABNORMAL HIGH (ref 3.5–5.1)
Sodium: 134 mmol/L — ABNORMAL LOW (ref 135–145)
Total Bilirubin: 0.4 mg/dL (ref 0.3–1.2)
Total Protein: 8 g/dL (ref 6.5–8.1)

## 2018-09-12 MED ORDER — IOHEXOL 300 MG/ML  SOLN
75.0000 mL | Freq: Once | INTRAMUSCULAR | Status: AC | PRN
  Administered 2018-09-12: 75 mL via INTRAVENOUS

## 2018-09-12 MED ORDER — SODIUM CHLORIDE (PF) 0.9 % IJ SOLN
INTRAMUSCULAR | Status: AC
  Filled 2018-09-12: qty 50

## 2018-09-14 ENCOUNTER — Encounter: Payer: Self-pay | Admitting: Internal Medicine

## 2018-09-14 ENCOUNTER — Inpatient Hospital Stay (HOSPITAL_BASED_OUTPATIENT_CLINIC_OR_DEPARTMENT_OTHER): Payer: BLUE CROSS/BLUE SHIELD | Admitting: Internal Medicine

## 2018-09-14 ENCOUNTER — Telehealth: Payer: Self-pay | Admitting: Internal Medicine

## 2018-09-14 VITALS — BP 123/74 | HR 80 | Temp 97.4°F | Resp 18 | Ht 62.0 in | Wt 129.2 lb

## 2018-09-14 DIAGNOSIS — C3490 Malignant neoplasm of unspecified part of unspecified bronchus or lung: Secondary | ICD-10-CM

## 2018-09-14 DIAGNOSIS — C3492 Malignant neoplasm of unspecified part of left bronchus or lung: Secondary | ICD-10-CM

## 2018-09-14 DIAGNOSIS — E875 Hyperkalemia: Secondary | ICD-10-CM

## 2018-09-14 DIAGNOSIS — Z85118 Personal history of other malignant neoplasm of bronchus and lung: Secondary | ICD-10-CM | POA: Diagnosis not present

## 2018-09-14 NOTE — Telephone Encounter (Signed)
Printed calendar and avs. °

## 2018-09-14 NOTE — Progress Notes (Signed)
[facility] Telephone:([phone]) [phone]   Fax:(336) [phone]  OFFICE PROGRESS NOTE  Via, Lennette Bihari, MD [place] [address]  DIAGNOSIS: stage IB (T2 a, N0, M0) non-small cell lung cancer, moderately differentiated adenocarcinoma  PRIOR THERAPY: status post left upper lobectomy with lymph node sampling on 02/15/2018 under the care of Dr. Roxan Hockey  CURRENT THERAPY: Observation.  INTERVAL HISTORY: Aimee Lewis 57 y.o. female returns to the clinic today for 6 months follow-up visit accompanied by her husband.  The patient is feeling fine today with no concerning complaints.  She denied having any chest pain, shortness of breath, cough or hemoptysis.  She denied having any fever or chills.  She has no nausea, vomiting, diarrhea or constipation.  She denied having any headache or visual changes.  The patient had repeat CT scan of the chest performed recently and she is here for evaluation and discussion of her scan results.  MEDICAL HISTORY: Past Medical History:  Diagnosis Date  . Chronic bronchitis ([facility])   . COPD (chronic obstructive pulmonary disease) ([place])   . Hypertension   . Lung cancer ([place] [facility]) dx'd 01/20/2018  . Pneumonia    "at least once" (02/21/2018)  . Pneumothorax 02/21/2018    ALLERGIES:  is allergic to codeine and darvon [propoxyphene].  MEDICATIONS:  Current Outpatient Medications  Medication Sig Dispense Refill  . acetaminophen (TYLENOL) 500 MG tablet Take 2 tablets (1,000 mg total) by mouth every 6 (six) hours. 30 tablet 0  . fluticasone (FLONASE) 50 MCG/ACT nasal spray Place 1 spray into both nostrils daily as needed for allergies or rhinitis.    . Fluticasone-Salmeterol (ADVAIR) 100-50 MCG/DOSE AEPB Inhale 1 puff into the lungs daily as needed (shortness of breath).    Marland Kitchen ibuprofen (ADVIL,MOTRIN) 200 MG tablet Take 400 mg by mouth daily as needed for headache or moderate pain.     Marland Kitchen lisinopril (PRINIVIL,ZESTRIL) 40 MG tablet Take  40 mg by mouth daily.     No current facility-administered medications for this visit.     SURGICAL HISTORY:  Past Surgical History:  Procedure Laterality Date  . BACK SURGERY    . CATARACT EXTRACTION W/ INTRAOCULAR LENS  IMPLANT, BILATERAL Bilateral 09/2017  . CHEST TUBE INSERTION Left 02/15/2018; 02/21/2018  . LUMBAR DISC SURGERY    . TONSILLECTOMY    . VIDEO ASSISTED THORACOSCOPY (VATS)/ LOBECTOMY Left 02/15/2018   Procedure: VIDEO ASSISTED THORACOSCOPY (VATS)/LEFT UPPER LOBECTOMY;  Surgeon: Melrose Nakayama, MD;  Location: [facility];  Service: Thoracic;  Laterality: Left;  Marland Kitchen VIDEO BRONCHOSCOPY WITH ENDOBRONCHIAL NAVIGATION N/A 01/18/2018   Procedure: VIDEO BRONCHOSCOPY WITH ENDOBRONCHIAL NAVIGATION WITH BIOPSIES;  Surgeon: Collene Gobble, MD;  Location: [facility];  Service: Thoracic;  Laterality: N/A;    REVIEW OF SYSTEMS:  A comprehensive review of systems was negative.   PHYSICAL EXAMINATION: General appearance: alert, cooperative and no distress Head: Normocephalic, without obvious abnormality, atraumatic Neck: no adenopathy, no JVD, supple, symmetrical, trachea midline and thyroid not enlarged, symmetric, no tenderness/mass/nodules Lymph nodes: Cervical, supraclavicular, and axillary nodes normal. Resp: clear to auscultation bilaterally Back: symmetric, no curvature. ROM normal. No CVA tenderness. Cardio: regular rate and rhythm, S1, S2 normal, no murmur, click, rub or gallop GI: soft, non-tender; bowel sounds normal; no masses,  no organomegaly Extremities: extremities normal, atraumatic, no cyanosis or edema  ECOG PERFORMANCE STATUS: 0 - Asymptomatic  Blood pressure 123/74, pulse 80, temperature (!) 97.4 F (36.3 C), temperature source Oral, resp. rate 18, height  5\' 2"  (1.575 m), weight 129 lb 3.2 oz (58.6 kg), SpO2 99 %.  LABORATORY DATA: Lab Results  Component Value Date   WBC 6.2 09/12/2018   HGB 11.2 (L) 09/12/2018   HCT 33.9 (L) 09/12/2018   MCV 97.7 09/12/2018    PLT 379 09/12/2018      Chemistry      Component Value Date/Time   NA 134 (L) 09/12/2018 0734   K 5.3 (H) 09/12/2018 0734   CL 97 (L) 09/12/2018 0734   CO2 25 09/12/2018 0734   BUN 16 09/12/2018 0734   CREATININE 1.10 (H) 09/12/2018 0734      Component Value Date/Time   CALCIUM 9.7 09/12/2018 0734   ALKPHOS 83 09/12/2018 0734   AST 19 09/12/2018 0734   ALT 9 09/12/2018 0734   BILITOT 0.4 09/12/2018 0734       RADIOGRAPHIC STUDIES: Ct Chest W Contrast  Result Date: 09/12/2018 CLINICAL DATA:  Lung cancer.  Six-month follow-up. EXAM: CT CHEST WITH CONTRAST TECHNIQUE: Multidetector CT imaging of the chest was performed during intravenous contrast administration. CONTRAST:  33mL OMNIPAQUE IOHEXOL 300 MG/ML  SOLN COMPARISON:  CT chest 01/05/2018. FINDINGS: Cardiovascular: The heart size is normal. No substantial pericardial effusion. Coronary artery calcification is evident. Atherosclerotic calcification is noted in the wall of the thoracic aorta. Mediastinum/Nodes: No mediastinal lymphadenopathy. Surgical changes noted in the left hilum. No right hilar lymphadenopathy. There is no axillary lymphadenopathy. Lungs/Pleura: Centrilobular emphysema noted bilaterally. Cluster of tiny nodules in the anterior right upper lobe (67/5) is unchanged in the interval. Interval left upper lobectomy. Residual left lung otherwise unremarkable. No evidence of pleural effusion. Upper Abdomen: 1.3 cm lesion inferior liver (168/2) shows peripheral nodular enhancement (well seen sagittal image 43/series 7) most compatible with cavernous hemangioma. Prior PET-CT showed no hypermetabolism in this region. Musculoskeletal: No worrisome lytic or sclerotic osseous abnormality. IMPRESSION: 1. Interval left upper lobectomy. No evidence for metastatic disease in the chest. 2. Stable appearance of the cluster of tiny nodules in the right upper lobe, likely scarring. Continued attention on follow-up suggested. 3. 1.3 cm  inferior right liver lesion shows peripheral nodular enhancement compatible with hemangioma. 4.  Emphysema. (ICD10-J43.9) 5.  Aortic Atherosclerois (ICD10-170.0) Electronically Signed   By: Misty Stanley M.D.   On: 09/12/2018 15:03    ASSESSMENT AND PLAN:  This is a very pleasant 57 years old white female with a stage Ib non-small cell lung cancer, moderately differentiated adenocarcinoma status post left upper lobectomy with lymph node dissection in May 2019 under the care of Dr. Roxan Hockey. The patient is currently on observation.  She is feeling fine with no concerning complaints. She had a repeat CT scan of the chest performed recently.  I personally and independently reviewed the scans and discussed the results with the patient and her husband. Her scan showed no concerning findings for disease recurrence. I recommended for the patient to continue on observation with repeat CT scan of the chest in 6 months. For the hyperkalemia, I recommended for the patient to discontinue any additional potassium supplements in her drinks as she is currently doing. The patient was advised to call immediately if she has any concerning symptoms in the interval. The patient voices understanding of current disease status and treatment options and is in agreement with the current care plan.  All questions were answered. The patient knows to call the clinic with any problems, questions or concerns. We can certainly see the patient much sooner if necessary.  I spent  10 minutes counseling the patient face to face. The total time spent in the appointment was 15 minutes.  Disclaimer: This note was dictated with voice recognition software. Similar sounding words can inadvertently be transcribed and may not be corrected upon review.

## 2018-10-21 ENCOUNTER — Other Ambulatory Visit: Payer: Self-pay

## 2018-10-21 ENCOUNTER — Emergency Department (HOSPITAL_COMMUNITY): Payer: BLUE CROSS/BLUE SHIELD

## 2018-10-21 ENCOUNTER — Encounter (HOSPITAL_COMMUNITY): Payer: Self-pay

## 2018-10-21 ENCOUNTER — Emergency Department (HOSPITAL_COMMUNITY)
Admission: EM | Admit: 2018-10-21 | Discharge: 2018-10-21 | Disposition: A | Discharge status: Home / Self Care | Payer: BLUE CROSS/BLUE SHIELD | Attending: Emergency Medicine | Admitting: Emergency Medicine

## 2018-10-21 DIAGNOSIS — R51 Headache: Secondary | ICD-10-CM | POA: Insufficient documentation

## 2018-10-21 DIAGNOSIS — J449 Chronic obstructive pulmonary disease, unspecified: Secondary | ICD-10-CM | POA: Insufficient documentation

## 2018-10-21 DIAGNOSIS — Z87891 Personal history of nicotine dependence: Secondary | ICD-10-CM | POA: Insufficient documentation

## 2018-10-21 DIAGNOSIS — R519 Headache, unspecified: Secondary | ICD-10-CM

## 2018-10-21 DIAGNOSIS — J0100 Acute maxillary sinusitis, unspecified: Secondary | ICD-10-CM | POA: Diagnosis not present

## 2018-10-21 DIAGNOSIS — Z85118 Personal history of other malignant neoplasm of bronchus and lung: Secondary | ICD-10-CM | POA: Insufficient documentation

## 2018-10-21 DIAGNOSIS — Z79899 Other long term (current) drug therapy: Secondary | ICD-10-CM | POA: Diagnosis not present

## 2018-10-21 DIAGNOSIS — I10 Essential (primary) hypertension: Secondary | ICD-10-CM | POA: Diagnosis not present

## 2018-10-21 MED ORDER — AMOXICILLIN-POT CLAVULANATE 875-125 MG PO TABS
1.0000 | ORAL_TABLET | Freq: Once | ORAL | Status: AC
  Administered 2018-10-21: 1 via ORAL
  Filled 2018-10-21: qty 1

## 2018-10-21 MED ORDER — AMOXICILLIN-POT CLAVULANATE 875-125 MG PO TABS: 1.0000 | ORAL_TABLET | Freq: Two times a day (BID) | ORAL | 0 refills | Status: AC

## 2018-10-21 NOTE — ED Provider Notes (Addendum)
Medical screening examination/treatment/procedure(s) were conducted as a shared visit with non-physician practitioner(s) and myself.  I personally evaluated the patient during the encounter.  None Patient with history of non-small cell lung cancer treated 7 months earlier has right sided headache and periorbital pain.  This is been present for 11 days.  No visual decrease, no double vision, no fever.  Patient is alert and appropriate.  Mental status clear.  No reproducible pain to palpation along the temple or periorbital area, reproducible percussion tenderness along the frontal sinus..  Extraocular motions are normal.  Pupils are symmetric and responsive.  Eye is noninjected, pupils are symmetric.  All patient's movements are coordinated purposeful symmetric.  With patient's history of lung cancer some concern for possible metastatic disease.  Agree with obtaining CT to further define/ rule out any intracranial pathology.  With 11 days of symptoms and reproducible facial pain I agree with plan of treating the patient with antibiotics for sinusitis.   Charlesetta Shanks, MD 10/21/18 1726    Charlesetta Shanks, MD 10/22/18 Bosie Helper

## 2018-10-21 NOTE — Discharge Instructions (Signed)
Please see the information and instructions below regarding your visit.  Your diagnoses today include:  1. Right-sided headache   2. Acute maxillary sinusitis, recurrence not specified    You were seen and treated in the emergency department today for headache. Fortunately, your vitals, exam, and work-up is reassuring with no apparent emergent cause for your headache at this time.  Tests performed today include: See side panel of your discharge paperwork for testing performed today. Vital signs are listed at the bottom of these instructions.    You have a normal head CT.  The CT of your sinuses show some mild thickening of the right side sinuses.  Medications prescribed:    Try to avoid daily or regular use of tylenol, aspirin, ibuprofen, and other overt-the-counter pain medications as this can contribute to rebound headaches.   Take any prescribed medications only as prescribed, and any over the counter medications only as directed on the packaging.  Please take all of your antibiotics until finished.   You may develop abdominal discomfort or nausea from the antibiotic. If this occurs, you may take it with food. Some patients also get diarrhea with antibiotics. You may help offset this with probiotics which you can buy or get in yogurt. Do not eat or take the probiotics until 2 hours after your antibiotic. Some women develop vaginal yeast infections after antibiotics. If you develop unusual vaginal discharge after being on this medication, please see your primary care provider.   Some people develop allergies to antibiotics. Symptoms of antibiotic allergy can be mild and include a flat rash and itching. They can also be more serious and include:  ?Hives - Hives are raised, red patches of skin that are usually very itchy.  ?Lip or tongue swelling  ?Trouble swallowing or breathing  ?Blistering of the skin or mouth.  If you have any of these serious symptoms, please seek emergency  medical care immediately.  You may take an over-the-counter intranasal medicine called Afrin.  Do not use this for more than 3 days at a time and refer to package dosing.  If you take it for more than 3 days at a time, it can increase congestion.  Home care instructions:   Drink plenty of fluids at home. This will help with your headache. Be cautious with caffeine use, as this can cause your headache to rebound when the effects wear off. If you drink more than 2 cups of coffee/caffeinated tea, or caffeinated soda per day, I suggest you wean down that amount.  Please follow any educational materials contained in this packet.   Follow-up instructions: Please follow-up with your primary care provider within the week for further evaluation of your symptoms if they are not completely improved.   If your headache is not improving with sinus treatment, please follow up with primary care.   Return instructions:  Please return to the Emergency Department if you experience worsening symptoms. It is VERY important that you monitor your symptoms at home. If you develop worsening headache, new fever, new neck stiffness, rash, focal weakness or numbness, or any other new or concerning symptoms, please return to the ED immediately, as these may be signs that your headache has become a potentially serious and life-threatening condition.  Please return if you have any other emergent concerns.  Additional Information:   Your vital signs today were: BP 128/72 (BP Location: Left Arm)    Pulse 74    Temp 97.6 F (36.4 C) (Oral)  Resp 18    Ht 5\' 2"  (1.575 m)    Wt 56.7 kg    SpO2 99%    BMI 22.86 kg/m  If your blood pressure (BP) was elevated on multiple readings during this visit above 130 for the top number or above 80 for the bottom number, please have this repeated by your primary care provider within one month. --------------  Thank you for allowing [place] to participate in your care today.

## 2018-10-21 NOTE — ED Notes (Signed)
Patient ambulated to CT

## 2018-10-21 NOTE — ED Triage Notes (Signed)
Patient arrived via POV with chief complaint of Headache since 7th of January where pain is located above right eye above brow and on right side of head. Patient is AOx4 and ambulatory.   Patient was sent to [facility] from PCP office. No numbness, weakness or tingling, blurred vision.

## 2018-10-21 NOTE — ED Provider Notes (Signed)
[facility] DEPT Provider Note   CSN: [id number] Arrival date & time: 10/21/18  1606     History   Chief Complaint Chief Complaint  Patient presents with  . Headache    HPI Aimee Lewis is a 58 y.o. female.  HPI  Patient is a 58 year old female with a history of COPD, hypertension, non-small cell lung cancer, in remission x7 months presenting for right-sided facial pain, periorbital pain, and frontal headache.  Patient reports she has had a headache for 11 days.  She reports she woke up with it 11 days ago and it is fairly constant.  She reports it does not interfere with sleep.  Patient denies having a significant history of headaches.  She reports she has had some sinus congestion and clear rhinorrhea since November 2019.  No purulent nasal secretions.  Patient denies any vision disturbance, jaw claudication, facial weakness or numbness, numbness or weakness of extremities, changes in speech, mentation, or ambulation.  Patient has any fever or chills over this interval.  She denies any neck pain or stiffness.  Patient has any rash.  Patient did travel outside the country to [place] 1 month ago.  No history of VTE.  Past Medical History:  Diagnosis Date  . Chronic bronchitis ([place])   . COPD (chronic obstructive pulmonary disease) ([place])   . Hypertension   . Lung cancer ([place]) dx'd 01/20/2018  . Pneumonia    "at least once" (02/21/2018)  . Pneumothorax 02/21/2018    Patient Active Problem List   Diagnosis Date Noted  . Subcutaneous emphysema ([place]) 03/08/2018  . Pneumothorax 02/21/2018  . S/P lobectomy of lung 02/15/2018  . Non-small cell lung cancer, left ([place]) 01/25/2018  . Abnormal CT of the chest 01/18/2018    Past Surgical History:  Procedure Laterality Date  . BACK SURGERY    . CATARACT EXTRACTION W/ INTRAOCULAR LENS  IMPLANT, BILATERAL Bilateral 09/2017  . CHEST TUBE INSERTION Left 02/15/2018; 02/21/2018  . LUMBAR DISC SURGERY    .  TONSILLECTOMY    . VIDEO ASSISTED THORACOSCOPY (VATS)/ LOBECTOMY Left 02/15/2018   Procedure: VIDEO ASSISTED THORACOSCOPY (VATS)/LEFT UPPER LOBECTOMY;  Surgeon: Melrose Nakayama, MD;  Location: [facility];  Service: Thoracic;  Laterality: Left;  Marland Kitchen VIDEO BRONCHOSCOPY WITH ENDOBRONCHIAL NAVIGATION N/A 01/18/2018   Procedure: VIDEO BRONCHOSCOPY WITH ENDOBRONCHIAL NAVIGATION WITH BIOPSIES;  Surgeon: Collene Gobble, MD;  Location: [facility];  Service: Thoracic;  Laterality: N/A;     OB History   No obstetric history on file.      Home Medications    Prior to Admission medications   Medication Sig Start Date End Date Taking? Authorizing Provider  acetaminophen (TYLENOL) 500 MG tablet Take 2 tablets (1,000 mg total) by mouth every 6 (six) hours. Patient not taking: Reported on 09/14/2018 02/19/18   Elgie Collard, PA-C  atorvastatin (LIPITOR) 20 MG tablet Take 20 mg by mouth daily.    [provider]  Fluticasone-Salmeterol (ADVAIR) 100-50 MCG/DOSE AEPB Inhale 1 puff into the lungs daily as needed (shortness of breath).    [provider]  ibuprofen (ADVIL,MOTRIN) 200 MG tablet Take 400 mg by mouth daily as needed for headache or moderate pain.     [provider]  lisinopril (PRINIVIL,ZESTRIL) 40 MG tablet Take 40 mg by mouth daily.    [provider]    Family History No family history on file.  Social History Social History   Tobacco Use  . Smoking status: Former Smoker  Packs/day: 1.00    Years: 38.00    Pack years: 38.00    Types: Cigarettes    Last attempt to quit: 02/12/2018    Years since quitting: 0.6  . Smokeless tobacco: Never Used  Substance Use Topics  . Alcohol use: Yes    Alcohol/week: 3.0 standard drinks    Types: 3 Glasses of wine per week  . Drug use: No     Allergies   Codeine and Darvon [propoxyphene]   Review of Systems Review of Systems  Constitutional: Negative for chills and fever.  HENT: Positive for congestion,  rhinorrhea and sinus pain. Negative for sore throat.   Eyes: Negative for photophobia and visual disturbance.  Respiratory: Negative for cough, chest tightness and shortness of breath.   Cardiovascular: Negative for chest pain and palpitations.  Gastrointestinal: Negative for abdominal pain, nausea and vomiting.  Genitourinary: Negative for dysuria and flank pain.  Musculoskeletal: Negative for back pain and myalgias.  Skin: Negative for rash.  Neurological: Positive for headaches. Negative for dizziness, syncope and light-headedness.     Physical Exam Updated Vital Signs BP 128/72 (BP Location: Left Arm)   Pulse 74   Temp 97.6 F (36.4 C) (Oral)   Resp 18   Ht 5\' 2"  (1.575 m)   Wt 56.7 kg   SpO2 99%   BMI 22.86 kg/m   Physical Exam Vitals signs and nursing note reviewed.  Constitutional:      General: She is not in acute distress.    Appearance: She is well-developed.  HENT:     Head: Normocephalic and atraumatic.  Eyes:     Conjunctiva/sclera: Conjunctivae normal.     Pupils: Pupils are equal, round, and reactive to light.  Neck:     Musculoskeletal: Normal range of motion and neck supple.  Cardiovascular:     Rate and Rhythm: Normal rate and regular rhythm.     Heart sounds: S1 normal and S2 normal. No murmur.  Pulmonary:     Effort: Pulmonary effort is normal.     Breath sounds: Normal breath sounds. No wheezing or rales.  Abdominal:     General: There is no distension.     Palpations: Abdomen is soft.     Tenderness: There is no abdominal tenderness. There is no guarding.  Musculoskeletal: Normal range of motion.        General: No deformity.  Lymphadenopathy:     Cervical: No cervical adenopathy.  Skin:    General: Skin is warm and dry.     Findings: No erythema or rash.  Neurological:     Mental Status: She is alert and oriented to person, place, and time.     GCS: GCS eye subscore is 4. GCS motor subscore is 6.     Comments: Mental Status:  Alert,  oriented, thought content appropriate, able to give a coherent history. Speech fluent without evidence of aphasia. Able to follow 2 step commands without difficulty.  Cranial Nerves:  II:  Peripheral visual fields grossly normal, pupils equal, round, reactive to light III,IV, VI: ptosis not present, extra-ocular motions intact bilaterally  V,VII: smile symmetric, facial light touch sensation equal VIII: hearing grossly normal to voice  X: uvula elevates symmetrically  XI: bilateral shoulder shrug symmetric and strong XII: midline tongue extension without fassiculations Motor:  Normal tone. 5/5 in upper and lower extremities bilaterally including strong and equal grip strength and dorsiflexion/plantar flexion Sensory: Pinprick and light touch normal in all extremities.  Cerebellar: normal finger-to-nose with  bilateral upper extremities Gait: normal gait and balance Stance: *No pronator drift and good coordination, strength, and position sense with tapping of bilateral arms (performed in sitting position). CV: distal pulses palpable throughout    Psychiatric:        Behavior: Behavior normal.        Thought Content: Thought content normal.        Judgment: Judgment normal.      ED Treatments / Results  Labs (all labs ordered are listed, but only abnormal results are displayed) Labs Reviewed - No data to display  EKG None  Radiology Ct Head Wo Contrast  Result Date: 10/21/2018 CLINICAL DATA:  RIGHT periorbital pain for 1 week. Pain above RIGHT eye. Accompanying headache. History of small cell lung cancer EXAM: CT HEAD WITHOUT CONTRAST CT MAXILLOFACIAL WITHOUT CONTRAST TECHNIQUE: Multidetector CT imaging of the head and maxillofacial structures were performed using the standard protocol without intravenous contrast. Multiplanar CT image reconstructions of the maxillofacial structures were also generated. COMPARISON:  Brain MRI 01/25/2018 FINDINGS: CT HEAD FINDINGS Brain: No acute  intracranial hemorrhage. No focal mass lesion. No CT evidence of acute infarction. No midline shift or mass effect. No hydrocephalus. Basilar cisterns are patent. Vascular: No hyperdense vessel or unexpected calcification. Skull: Normal. Negative for fracture or focal lesion. Sinuses/Orbits: Paranasal sinuses and mastoid air cells are clear. Orbits are clear. Other: None. CT MAXILLOFACIAL FINDINGS Osseous: Orbital walls are normal. Zygomatic arches are normal. Frontal bone normal. Maxillary sinuses para pterygoid plates normal. Mandibular condyles normal. Mandible normal. Orbits: Globes are normal. Intraconal contents are normal. No periorbital abnormality. Sinuses: Mild mucosal thickening of the RIGHT maxillary sinus. Soft tissues: Unremarkable IMPRESSION: 1. Normal head CT. 2. Mild mucosal thickening in the RIGHT maxillary sinus. 3. Normal RIGHT orbit and periorbital tissue. Electronically Signed   By: Suzy Bouchard M.D.   On: 10/21/2018 18:16   Ct Maxillofacial Wo Contrast  Result Date: 10/21/2018 CLINICAL DATA:  RIGHT periorbital pain for 1 week. Pain above RIGHT eye. Accompanying headache. History of small cell lung cancer EXAM: CT HEAD WITHOUT CONTRAST CT MAXILLOFACIAL WITHOUT CONTRAST TECHNIQUE: Multidetector CT imaging of the head and maxillofacial structures were performed using the standard protocol without intravenous contrast. Multiplanar CT image reconstructions of the maxillofacial structures were also generated. COMPARISON:  Brain MRI 01/25/2018 FINDINGS: CT HEAD FINDINGS Brain: No acute intracranial hemorrhage. No focal mass lesion. No CT evidence of acute infarction. No midline shift or mass effect. No hydrocephalus. Basilar cisterns are patent. Vascular: No hyperdense vessel or unexpected calcification. Skull: Normal. Negative for fracture or focal lesion. Sinuses/Orbits: Paranasal sinuses and mastoid air cells are clear. Orbits are clear. Other: None. CT MAXILLOFACIAL FINDINGS Osseous:  Orbital walls are normal. Zygomatic arches are normal. Frontal bone normal. Maxillary sinuses para pterygoid plates normal. Mandibular condyles normal. Mandible normal. Orbits: Globes are normal. Intraconal contents are normal. No periorbital abnormality. Sinuses: Mild mucosal thickening of the RIGHT maxillary sinus. Soft tissues: Unremarkable IMPRESSION: 1. Normal head CT. 2. Mild mucosal thickening in the RIGHT maxillary sinus. 3. Normal RIGHT orbit and periorbital tissue. Electronically Signed   By: Suzy Bouchard M.D.   On: 10/21/2018 18:16    Procedures Procedures (including critical care time)  Medications Ordered in ED Medications - No data to display   Initial Impression / Assessment and Plan / ED Course  I have reviewed the triage vital signs and the nursing notes.  Pertinent labs & imaging results that were available during my care of the patient were  reviewed by me and considered in my medical decision making (see chart for details).     Patient without high-risk features of headache including: sudden onset/thunderclap HA, no similar headache in past, altered mental status, accompanying seizure, headache with exertion, history of immunocompromise, neck or shoulder pain, fever, use of anticoagulation, family history of spontaneous SAH, concomitant drug use, toxic exposure.  Patient's 10 a time course of symptoms is also reassuring.  Patient has a normal complete neurological exam, normal vital signs, normal level of consciousness, no signs of meningismus, is well-appearing/non-toxic appearing, no signs of trauma. No papilledema on funduscopic exam, no pain over the temporal arteries.  No vision disturbance or jaw claudication to suggest temporal arteritis.  Given patient sinus symptoms, will obtain CT head and CT sinuses.  Patient has mild right mucosal thickening of the sinuses.  Will initiate trial of antibiotics and decongestants.   No dangerous or life-threatening conditions  suspected or identified by history, physical exam, and by work-up. No indications for hospitalization identified.   This is a shared visit with Dr. Charlesetta Shanks. Patient was independently evaluated by this attending physician. Attending physician consulted in evaluation and discharge management.  Final Clinical Impressions(s) / ED Diagnoses   Final diagnoses:  Right-sided headache  Acute maxillary sinusitis, recurrence not specified    ED Discharge Orders    None       Tamala Julian 10/21/18 Doran Heater    Charlesetta Shanks, MD 10/22/18 1839

## 2018-11-10 DIAGNOSIS — J0110 Acute frontal sinusitis, unspecified: Secondary | ICD-10-CM | POA: Diagnosis not present

## 2018-11-10 DIAGNOSIS — J0100 Acute maxillary sinusitis, unspecified: Secondary | ICD-10-CM | POA: Diagnosis not present

## 2019-02-06 ENCOUNTER — Encounter: Payer: BLUE CROSS/BLUE SHIELD | Admitting: Thoracic Surgery (Cardiothoracic Vascular Surgery)

## 2019-03-16 ENCOUNTER — Inpatient Hospital Stay: Payer: BC Managed Care – PPO | Attending: Internal Medicine

## 2019-03-16 ENCOUNTER — Other Ambulatory Visit: Payer: Self-pay

## 2019-03-16 ENCOUNTER — Ambulatory Visit (HOSPITAL_COMMUNITY)
Admission: RE | Admit: 2019-03-16 | Discharge: 2019-03-16 | Disposition: A | Discharge status: Home / Self Care | Payer: BC Managed Care – PPO | Source: Ambulatory Visit | Attending: Internal Medicine | Admitting: Internal Medicine

## 2019-03-16 DIAGNOSIS — R911 Solitary pulmonary nodule: Secondary | ICD-10-CM | POA: Diagnosis not present

## 2019-03-16 DIAGNOSIS — C3490 Malignant neoplasm of unspecified part of unspecified bronchus or lung: Secondary | ICD-10-CM

## 2019-03-16 DIAGNOSIS — I700 Atherosclerosis of aorta: Secondary | ICD-10-CM | POA: Diagnosis not present

## 2019-03-16 DIAGNOSIS — Z85118 Personal history of other malignant neoplasm of bronchus and lung: Secondary | ICD-10-CM | POA: Insufficient documentation

## 2019-03-16 DIAGNOSIS — J439 Emphysema, unspecified: Secondary | ICD-10-CM | POA: Diagnosis not present

## 2019-03-16 LAB — CBC WITH DIFFERENTIAL (CANCER CENTER ONLY)
Abs Immature Granulocytes: 0.01 10*3/uL (ref 0.00–0.07)
Basophils Absolute: 0.1 10*3/uL (ref 0.0–0.1)
Basophils Relative: 1 %
Eosinophils Absolute: 0.3 10*3/uL (ref 0.0–0.5)
Eosinophils Relative: 4 %
HCT: 42.6 % (ref 36.0–46.0)
Hemoglobin: 13.7 g/dL (ref 12.0–15.0)
Immature Granulocytes: 0 %
Lymphocytes Relative: 31 %
Lymphs Abs: 1.9 10*3/uL (ref 0.7–4.0)
MCH: 32.5 pg (ref 26.0–34.0)
MCHC: 32.2 g/dL (ref 30.0–36.0)
MCV: 100.9 fL — ABNORMAL HIGH (ref 80.0–100.0)
Monocytes Absolute: 0.5 10*3/uL (ref 0.1–1.0)
Monocytes Relative: 9 %
Neutro Abs: 3.2 10*3/uL (ref 1.7–7.7)
Neutrophils Relative %: 55 %
Platelet Count: 316 10*3/uL (ref 150–400)
RBC: 4.22 MIL/uL (ref 3.87–5.11)
RDW: 13.1 % (ref 11.5–15.5)
WBC Count: 5.9 10*3/uL (ref 4.0–10.5)
nRBC: 0 % (ref 0.0–0.2)

## 2019-03-16 LAB — CMP (CANCER CENTER ONLY)
ALT: 15 U/L (ref 0–44)
AST: 23 U/L (ref 15–41)
Albumin: 4.5 g/dL (ref 3.5–5.0)
Alkaline Phosphatase: 69 U/L (ref 38–126)
Anion gap: 12 (ref 5–15)
BUN: 16 mg/dL (ref 6–20)
CO2: 26 mmol/L (ref 22–32)
Calcium: 9.9 mg/dL (ref 8.9–10.3)
Chloride: 100 mmol/L (ref 98–111)
Creatinine: 1 mg/dL (ref 0.44–1.00)
GFR, Est AFR Am: >60 mL/min (ref >60)
GFR, Est Non Af Am: >60 mL/min (ref >60)
Glucose, Bld: 101 mg/dL — ABNORMAL HIGH (ref 70–99)
Potassium: 4.7 mmol/L (ref 3.5–5.1)
Sodium: 138 mmol/L (ref 135–145)
Total Bilirubin: 0.4 mg/dL (ref 0.3–1.2)
Total Protein: 8.1 g/dL (ref 6.5–8.1)

## 2019-03-16 MED ORDER — SODIUM CHLORIDE (PF) 0.9 % IJ SOLN
INTRAMUSCULAR | Status: AC
  Filled 2019-03-16: qty 50

## 2019-03-16 MED ORDER — IOHEXOL 300 MG/ML  SOLN
75.0000 mL | Freq: Once | INTRAMUSCULAR | Status: AC | PRN
  Administered 2019-03-16: 75 mL via INTRAVENOUS

## 2019-03-21 ENCOUNTER — Encounter: Payer: Self-pay | Admitting: Internal Medicine

## 2019-03-21 ENCOUNTER — Other Ambulatory Visit: Payer: Self-pay

## 2019-03-21 ENCOUNTER — Inpatient Hospital Stay (HOSPITAL_BASED_OUTPATIENT_CLINIC_OR_DEPARTMENT_OTHER): Payer: BC Managed Care – PPO | Admitting: Internal Medicine

## 2019-03-21 VITALS — BP 128/64 | HR 96 | Temp 97.4°F | Resp 18 | Ht 62.0 in | Wt 126.7 lb

## 2019-03-21 DIAGNOSIS — Z85118 Personal history of other malignant neoplasm of bronchus and lung: Secondary | ICD-10-CM

## 2019-03-21 DIAGNOSIS — C3492 Malignant neoplasm of unspecified part of left bronchus or lung: Secondary | ICD-10-CM

## 2019-03-21 DIAGNOSIS — C3490 Malignant neoplasm of unspecified part of unspecified bronchus or lung: Secondary | ICD-10-CM

## 2019-03-21 DIAGNOSIS — Z902 Acquired absence of lung [part of]: Secondary | ICD-10-CM

## 2019-03-21 IMAGING — DX DG CHEST 2V
2 series · 2 of 2 positions shown · non-contrast
Comparison: PA and lateral chest 02/19/2017 and 02/18/2018.

CLINICAL DATA: Status post VATS and left upper lobectomy
02/15/2018. Left chest pain and shortness of breath.

EXAM:
CHEST - 2 VIEW

[dg chest 2 view (1 of 2)]
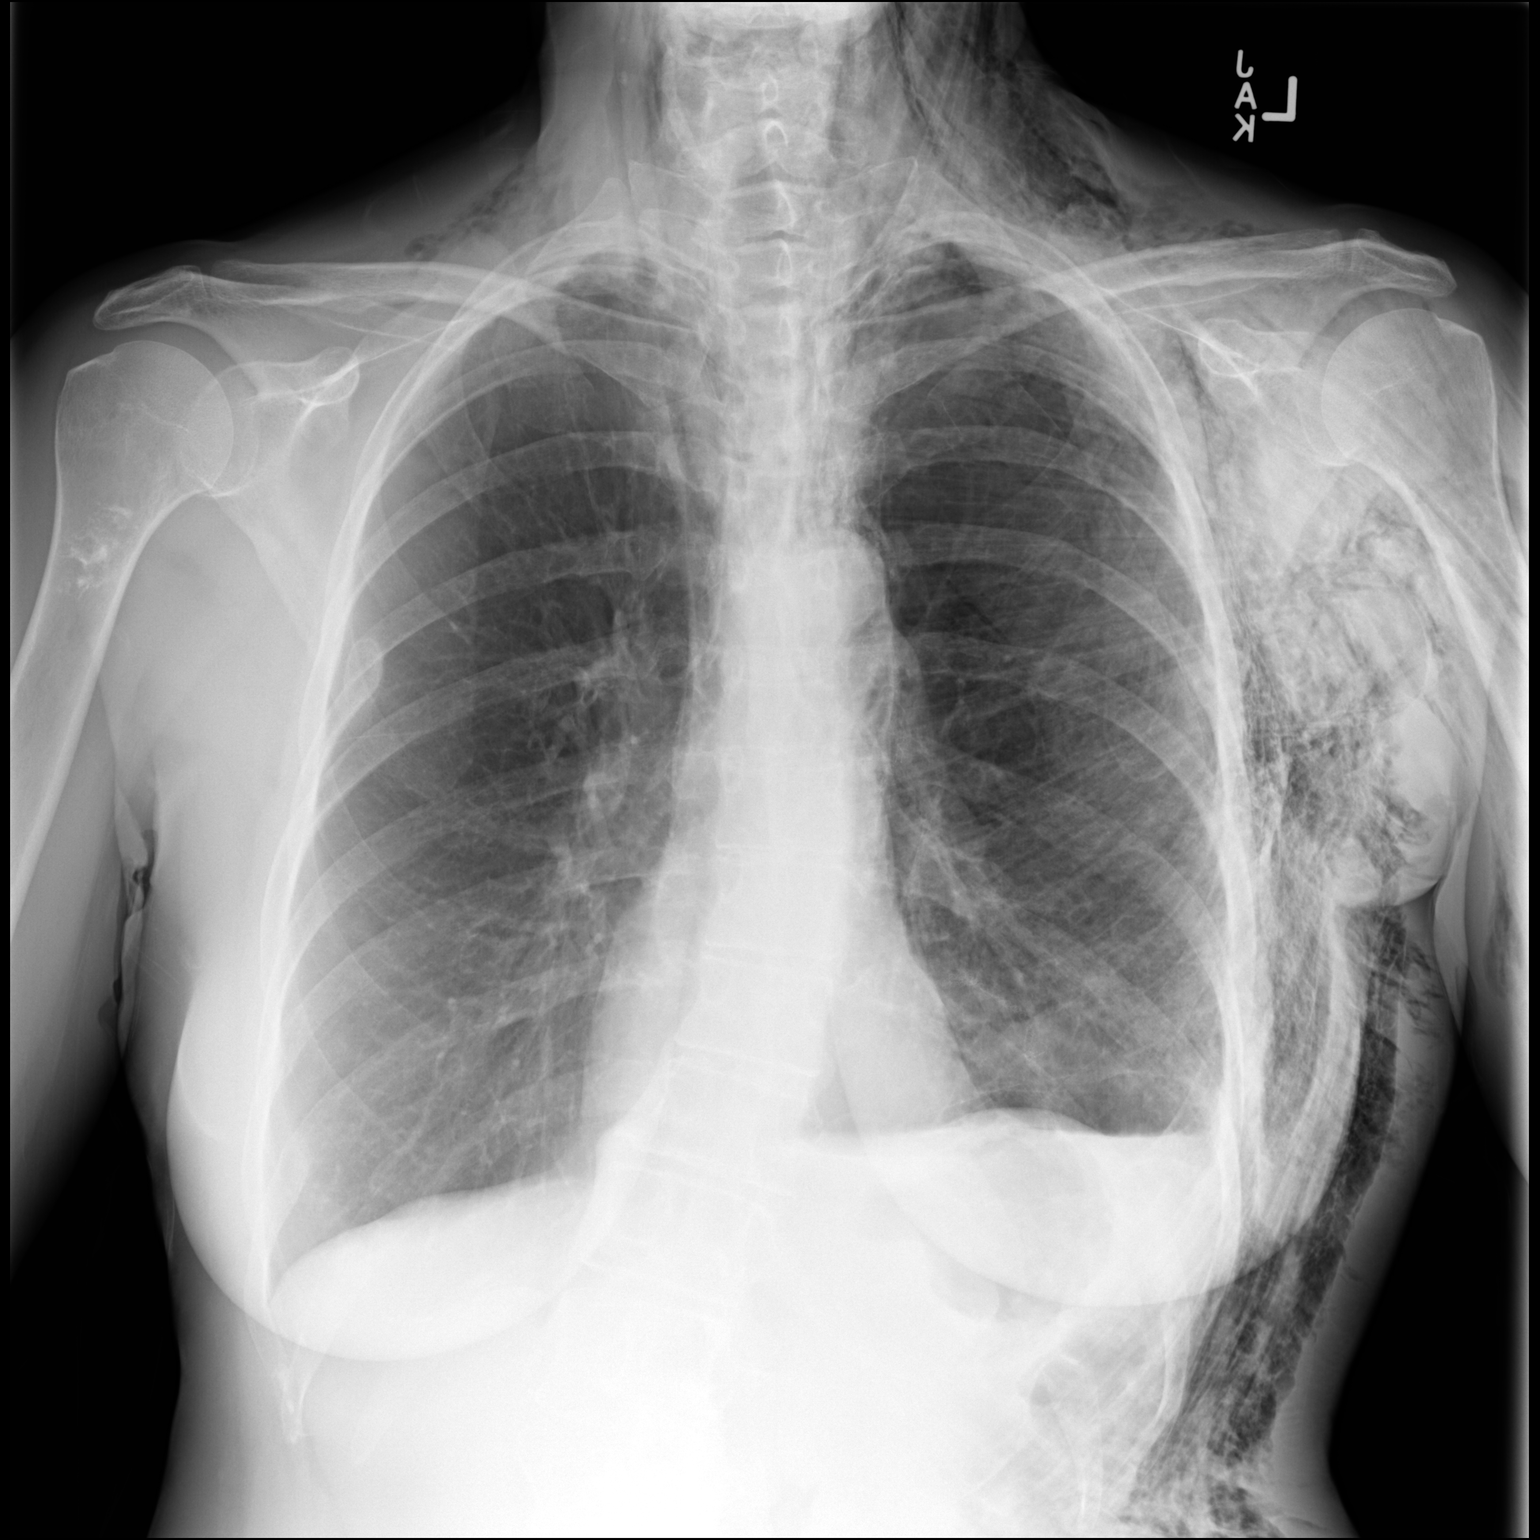

[dg chest 2 view (2 of 2)]
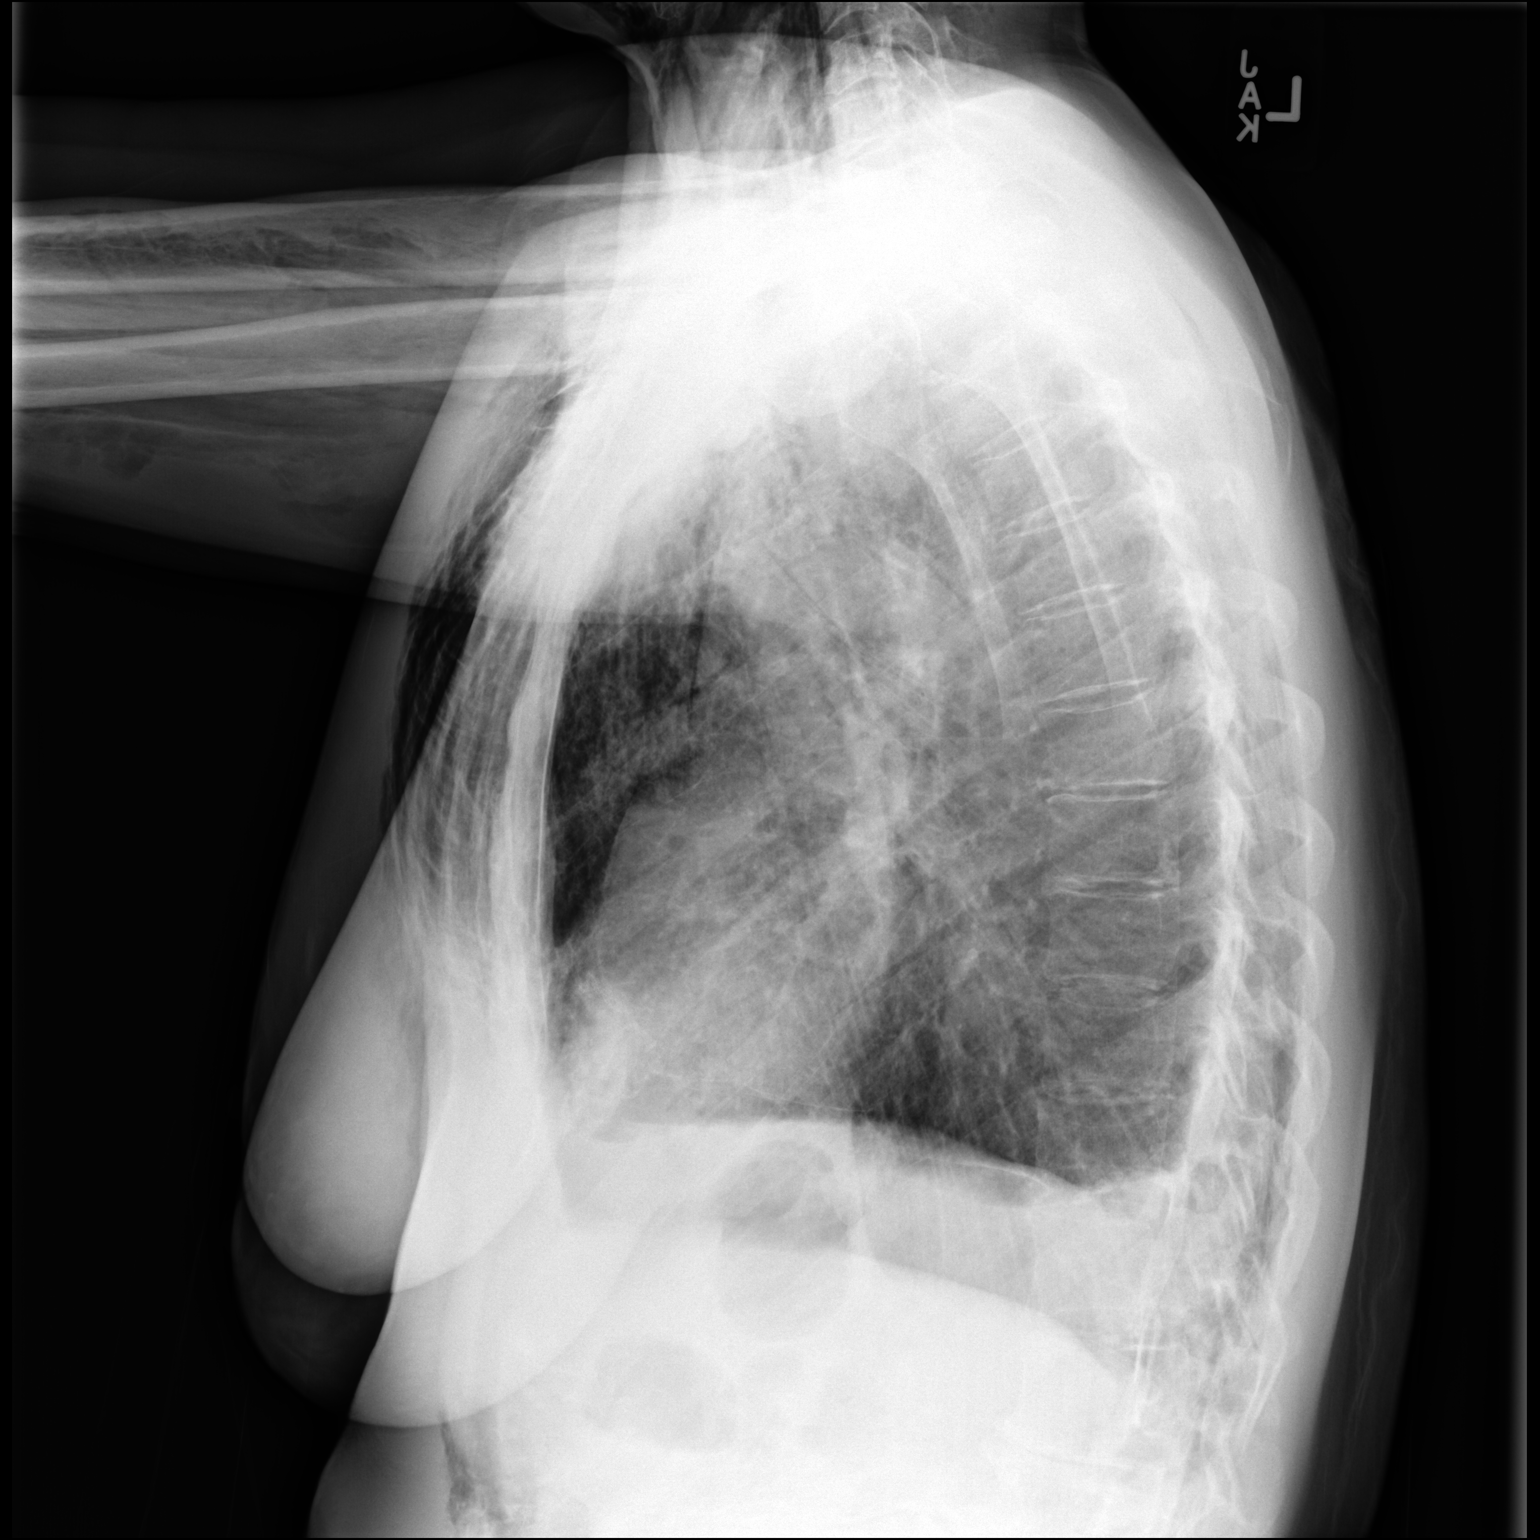

[2 of 2 positions shown; findings below may reference images not displayed]

FINDINGS: Right IJ catheter has been removed since the most recent
examination. Subcutaneous emphysema over the left chest has markedly
increased since the most recent exam and there is new
pneumomediastinum. Left pneumothorax has increased in size and is
estimated at 30-35%. There is a small left pleural effusion. The
right lung is clear. Heart size is normal. Remote right rib fracture
and scoliosis noted.
IMPRESSION: Increase in the size of a left pneumothorax now estimated 30-35%.

Marked increase in subcutaneous emphysema and new pneumomediastinum.

Critical Value/emergent results were called by telephone at the time
of interpretation on 02/21/2018 at [DATE] to INGUNN HARPA RONLOR, RN , who
verbally acknowledged these results.

## 2019-03-21 IMAGING — DX DG CHEST 1V PORT
1 series · 1 of 1 positions shown · non-contrast
Comparison: 02/21/2018

CLINICAL DATA: Lung cancer.  Chest tube placement.

EXAM:
PORTABLE CHEST 1 VIEW

[chest ap]
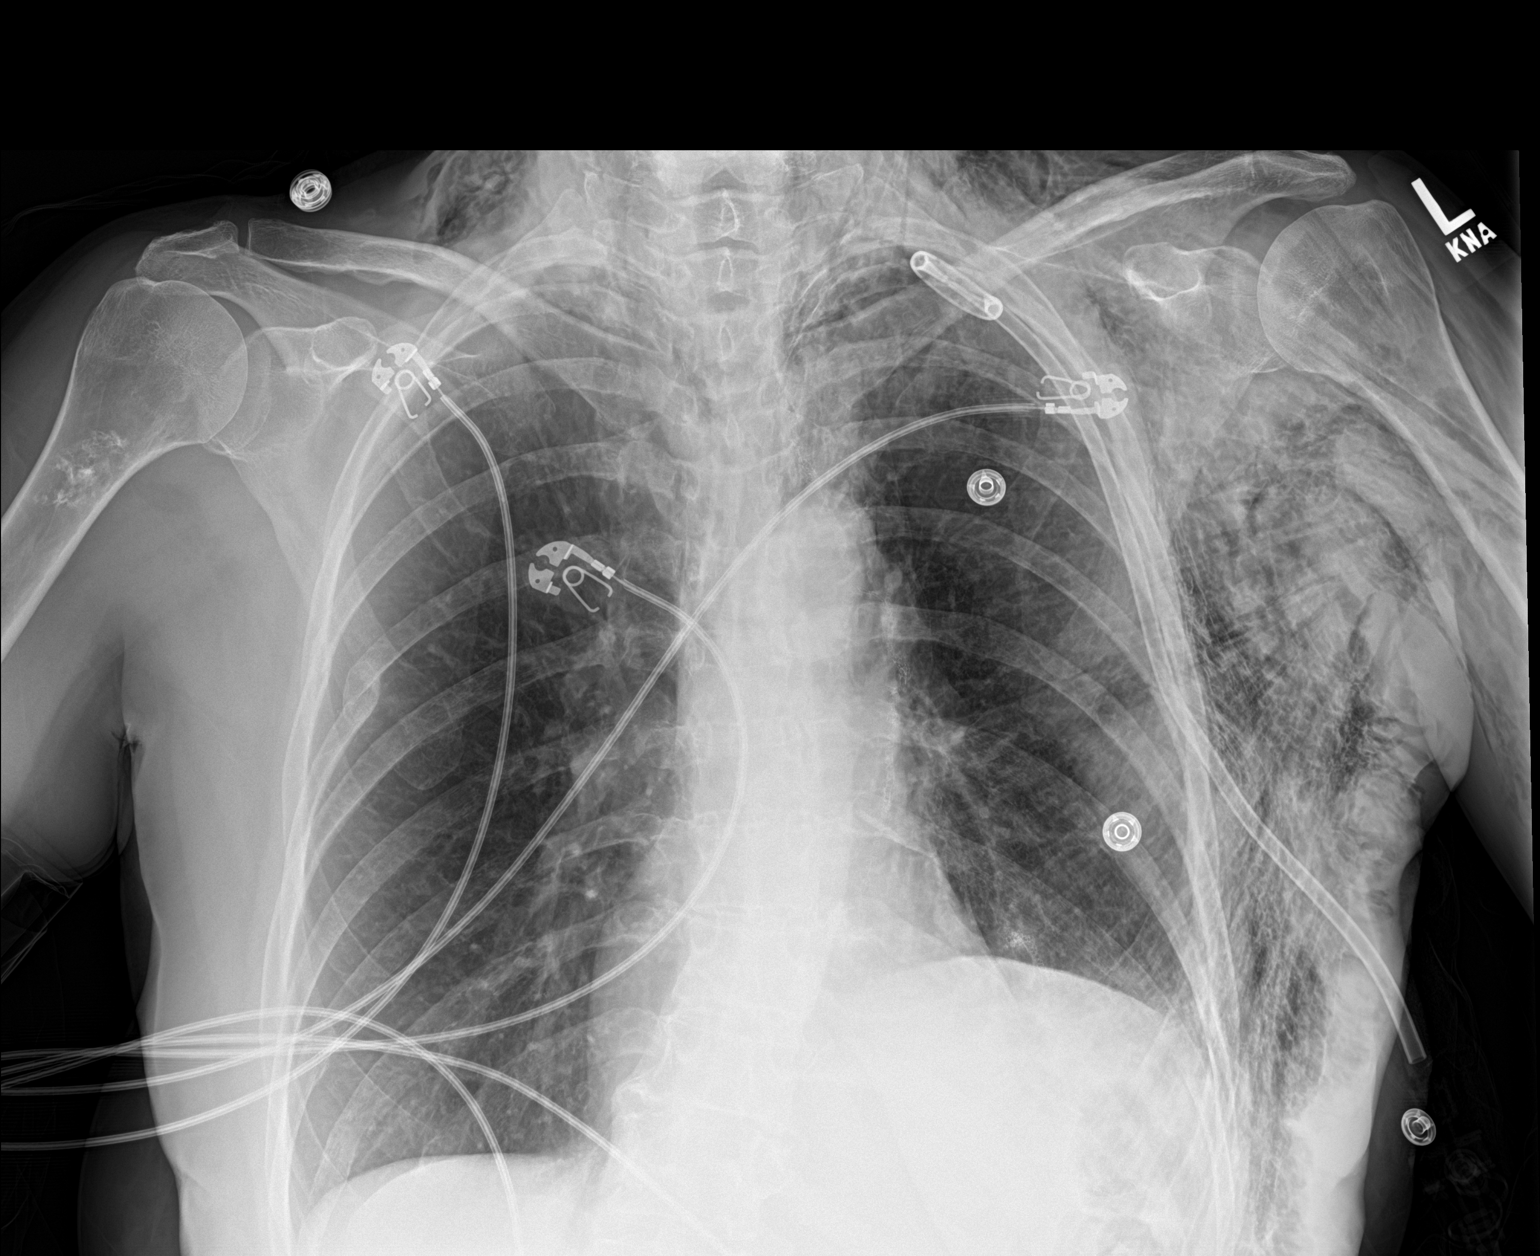

[1 of 1 positions shown; findings below may reference images not displayed]

FINDINGS: Interval placement of left chest tube. Increasing subcutaneous
emphysema throughout the left chest wall, and neck bilaterally.
Small residual left apical pneumothorax, decreased in size since
prior study, less than 5%. Elevation of the left hemidiaphragm.
Postoperative changes on the left. Right lung clear. Heart is normal
size.
IMPRESSION: Interval placement of left chest tube with decreasing size of the
left pneumothorax, now less than 5%. Increasing subcutaneous
emphysema throughout the left chest wall and neck bilaterally.

## 2019-03-21 NOTE — Progress Notes (Signed)
[facility] Telephone:([phone]) [phone]   Fax:([phone]  OFFICE PROGRESS NOTE  Via, Lennette Bihari, MD [place] [address]  DIAGNOSIS: stage IB (T2 a, N0, M0) non-small cell lung cancer, moderately differentiated adenocarcinoma  PRIOR THERAPY: status post left upper lobectomy with lymph node sampling on 02/15/2018 under the care of Dr. Roxan Hockey  CURRENT THERAPY: Observation.  INTERVAL HISTORY: Aimee Lewis 58 y.o. female returns to the clinic today for follow-up visit.  The patient is feeling fine today with no concerning complaints.  She denied having any chest pain, shortness of breath, cough or hemoptysis.  She denied having any fever or chills.  She denied having any nausea, vomiting, diarrhea or constipation.  The patient had repeat CT scan of the chest performed recently and she is here today for evaluation and discussion of her scan results and recommendation regarding her condition.  MEDICAL HISTORY: Past Medical History:  Diagnosis Date   Chronic bronchitis ([facility])    COPD (chronic obstructive pulmonary disease) ([facility])    Hypertension    Lung cancer ([place]) dx'd 01/20/2018   Pneumonia    "at least once" (02/21/2018)   Pneumothorax 02/21/2018    ALLERGIES:  is allergic to codeine and darvon [propoxyphene].  MEDICATIONS:  Current Outpatient Medications  Medication Sig Dispense Refill   acetaminophen (TYLENOL) 500 MG tablet Take 2 tablets (1,000 mg total) by mouth every 6 (six) hours. (Patient not taking: Reported on 09/14/2018) 30 tablet 0   atorvastatin (LIPITOR) 20 MG tablet Take 20 mg by mouth daily.     Fluticasone-Salmeterol (ADVAIR) 100-50 MCG/DOSE AEPB Inhale 1 puff into the lungs daily as needed (shortness of breath).     ibuprofen (ADVIL,MOTRIN) 200 MG tablet Take 400 mg by mouth daily as needed for headache or moderate pain.      lisinopril (PRINIVIL,ZESTRIL) 40 MG tablet Take 40 mg by mouth daily.     No current  facility-administered medications for this visit.     SURGICAL HISTORY:  Past Surgical History:  Procedure Laterality Date   BACK SURGERY     CATARACT EXTRACTION W/ INTRAOCULAR LENS  IMPLANT, BILATERAL Bilateral 09/2017   CHEST TUBE INSERTION Left 02/15/2018; 02/21/2018   LUMBAR DISC SURGERY     TONSILLECTOMY     VIDEO ASSISTED THORACOSCOPY (VATS)/ LOBECTOMY Left 02/15/2018   Procedure: VIDEO ASSISTED THORACOSCOPY (VATS)/LEFT UPPER LOBECTOMY;  Surgeon: Melrose Nakayama, MD;  Location: [facility];  Service: Thoracic;  Laterality: Left;   VIDEO BRONCHOSCOPY WITH ENDOBRONCHIAL NAVIGATION N/A 01/18/2018   Procedure: VIDEO BRONCHOSCOPY WITH ENDOBRONCHIAL NAVIGATION WITH BIOPSIES;  Surgeon: Collene Gobble, MD;  Location: [facility];  Service: Thoracic;  Laterality: N/A;    REVIEW OF SYSTEMS:  Constitutional: negative Eyes: negative Ears, nose, mouth, throat, and face: negative Respiratory: negative Cardiovascular: negative Gastrointestinal: negative Genitourinary:negative Integument/breast: negative Hematologic/lymphatic: negative Musculoskeletal:negative Neurological: negative Behavioral/Psych: negative Endocrine: negative Allergic/Immunologic: negative   PHYSICAL EXAMINATION: General appearance: alert, cooperative and no distress Head: Normocephalic, without obvious abnormality, atraumatic Neck: no adenopathy, no JVD, supple, symmetrical, trachea midline and thyroid not enlarged, symmetric, no tenderness/mass/nodules Lymph nodes: Cervical, supraclavicular, and axillary nodes normal. Resp: clear to auscultation bilaterally Back: symmetric, no curvature. ROM normal. No CVA tenderness. Cardio: regular rate and rhythm, S1, S2 normal, no murmur, click, rub or gallop GI: soft, non-tender; bowel sounds normal; no masses,  no organomegaly Extremities: extremities normal, atraumatic, no cyanosis or edema Neurologic: Alert and oriented X 3, normal strength and tone. Normal symmetric  reflexes. Normal coordination and gait  ECOG PERFORMANCE STATUS: 0 - Asymptomatic  Blood pressure 128/64, pulse 96, temperature (!) 97.4 F (36.3 C), temperature source Oral, resp. rate 18, height 5\' 2"  (1.575 m), weight 126 lb 11.2 oz (57.5 kg), SpO2 99 %.  LABORATORY DATA: Lab Results  Component Value Date   WBC 5.9 03/16/2019   HGB 13.7 03/16/2019   HCT 42.6 03/16/2019   MCV 100.9 (H) 03/16/2019   PLT 316 03/16/2019      Chemistry      Component Value Date/Time   NA 138 03/16/2019 0908   K 4.7 03/16/2019 0908   CL 100 03/16/2019 0908   CO2 26 03/16/2019 0908   BUN 16 03/16/2019 0908   CREATININE 1.00 03/16/2019 0908      Component Value Date/Time   CALCIUM 9.9 03/16/2019 0908   ALKPHOS 69 03/16/2019 0908   AST 23 03/16/2019 0908   ALT 15 03/16/2019 0908   BILITOT 0.4 03/16/2019 0908       RADIOGRAPHIC STUDIES: Ct Chest W Contrast  Result Date: 03/16/2019 CLINICAL DATA:  Lung cancer.  Status post left upper lobectomy. EXAM: CT CHEST WITH CONTRAST TECHNIQUE: Multidetector CT imaging of the chest was performed during intravenous contrast administration. CONTRAST:  75mL OMNIPAQUE IOHEXOL 300 MG/ML  SOLN COMPARISON:  09/12/2018 FINDINGS: Cardiovascular: The heart size is normal. No pericardial effusion. Aortic atherosclerosis. Calcification in the left main, lad, left circumflex and RCA noted. Mediastinum/Nodes: No enlarged mediastinal, hilar, or axillary lymph nodes. Thyroid gland, trachea, and esophagus demonstrate no significant findings. Lungs/Pleura: No pleural effusion. Emphysema. Status post left upper lobectomy. No complications. Scattered sub solid nodules are noted: Anterior right middle lobe sub solid nodule measures 8 mm, image 91/7. New from previous exam. Part solid nodule within the lateral right upper lobe measures 0.9 cm, image 44/7. New from previous exam. Anterior left lower lobe sub solid nodule measures 6 mm, image 29/7. New. Anterior left upper lobe sub  solid nodule measures 7 mm, image 29/7. New. Scattered millimetric solid nodules are also identified: Left lower lobe nodule measures 5 mm, image 72/7. New. Lateral left lower lobe solid nodule measures 3 mm, image 72/7. New. Anterior right upper lobe solid nodule measures 3 mm, image 77/7. Unchanged. Anterior right lower lobe lung nodule measures 3 mm, image 117/7. New. Posteromedial right upper lobe solid nodule measures 4 mm, image 46/7. New from previous exam Upper Abdomen: No acute abnormality. Musculoskeletal: No chest wall abnormality. No acute or significant osseous findings. Scoliosis and degenerative disc disease within the thoracic spine. There is a chondroid lesion within the proximal right humerus which likely represents either a benign bone infarct or enchondroma. IMPRESSION: 1. Status post left upper lobectomy.  No complications. 2. Interval development of multifocal sub solid nodules within both lungs. The largest of these is in the right upper lobe measuring 9 mm. Although nonspecific, multifocal adenocarcinoma in this patient with known pulmonary adenocarcinoma cannot be excluded. Recommend repeat Non-contrast chest CT at 3-6 months to confirm persistence. 3. Multiple new small scattered solid nodules are also noted in both lungs. The largest of these is in the left lower lobe measuring 5 mm. Attention at follow-up imaging is recommended. 4. Aortic Atherosclerosis (ICD10-I70.0) and Emphysema (ICD10-J43.9). 5. Coronary artery calcifications. Electronically Signed   By: Kerby Moors M.D.   On: 03/16/2019 10:59     ASSESSMENT AND PLAN:  This is a very pleasant 58 years old white female with a stage Ib non-small cell lung cancer,  moderately differentiated adenocarcinoma status post left upper lobectomy with lymph node dissection in May 2019 under the care of Dr. Roxan Hockey. The patient is currently on observation and she is feeling fine today with no concerning complaints.  She had repeat CT  scan of the chest performed recently.  I personally and independently reviewed the scan images and discussed the result and showed the images to the patient today.  Unfortunately Her scan showed multifocal areas of sub-solid nodules in both lungs.  These are concerning for low-grade adenocarcinoma versus inflammatory process. I recommended for the patient to have repeat CT scan of the chest in 3 months for reevaluation of her disease and to rule out any underlying malignancy in these nodules. She was also advised to call immediately if she has any concerning symptoms in the interval. The patient voices understanding of current disease status and treatment options and is in agreement with the current care plan.  All questions were answered. The patient knows to call the clinic with any problems, questions or concerns. We can certainly see the patient much sooner if necessary.   Disclaimer: This note was dictated with voice recognition software. Similar sounding words can inadvertently be transcribed and may not be corrected upon review.

## 2019-03-22 ENCOUNTER — Telehealth: Payer: Self-pay | Admitting: Internal Medicine

## 2019-03-22 IMAGING — DX DG CHEST 1V PORT
1 series · 1 of 1 positions shown · non-contrast
Comparison: 02/21/2018

CLINICAL DATA: Follow-up pneumothorax

EXAM:
PORTABLE CHEST 1 VIEW

[chest ap]
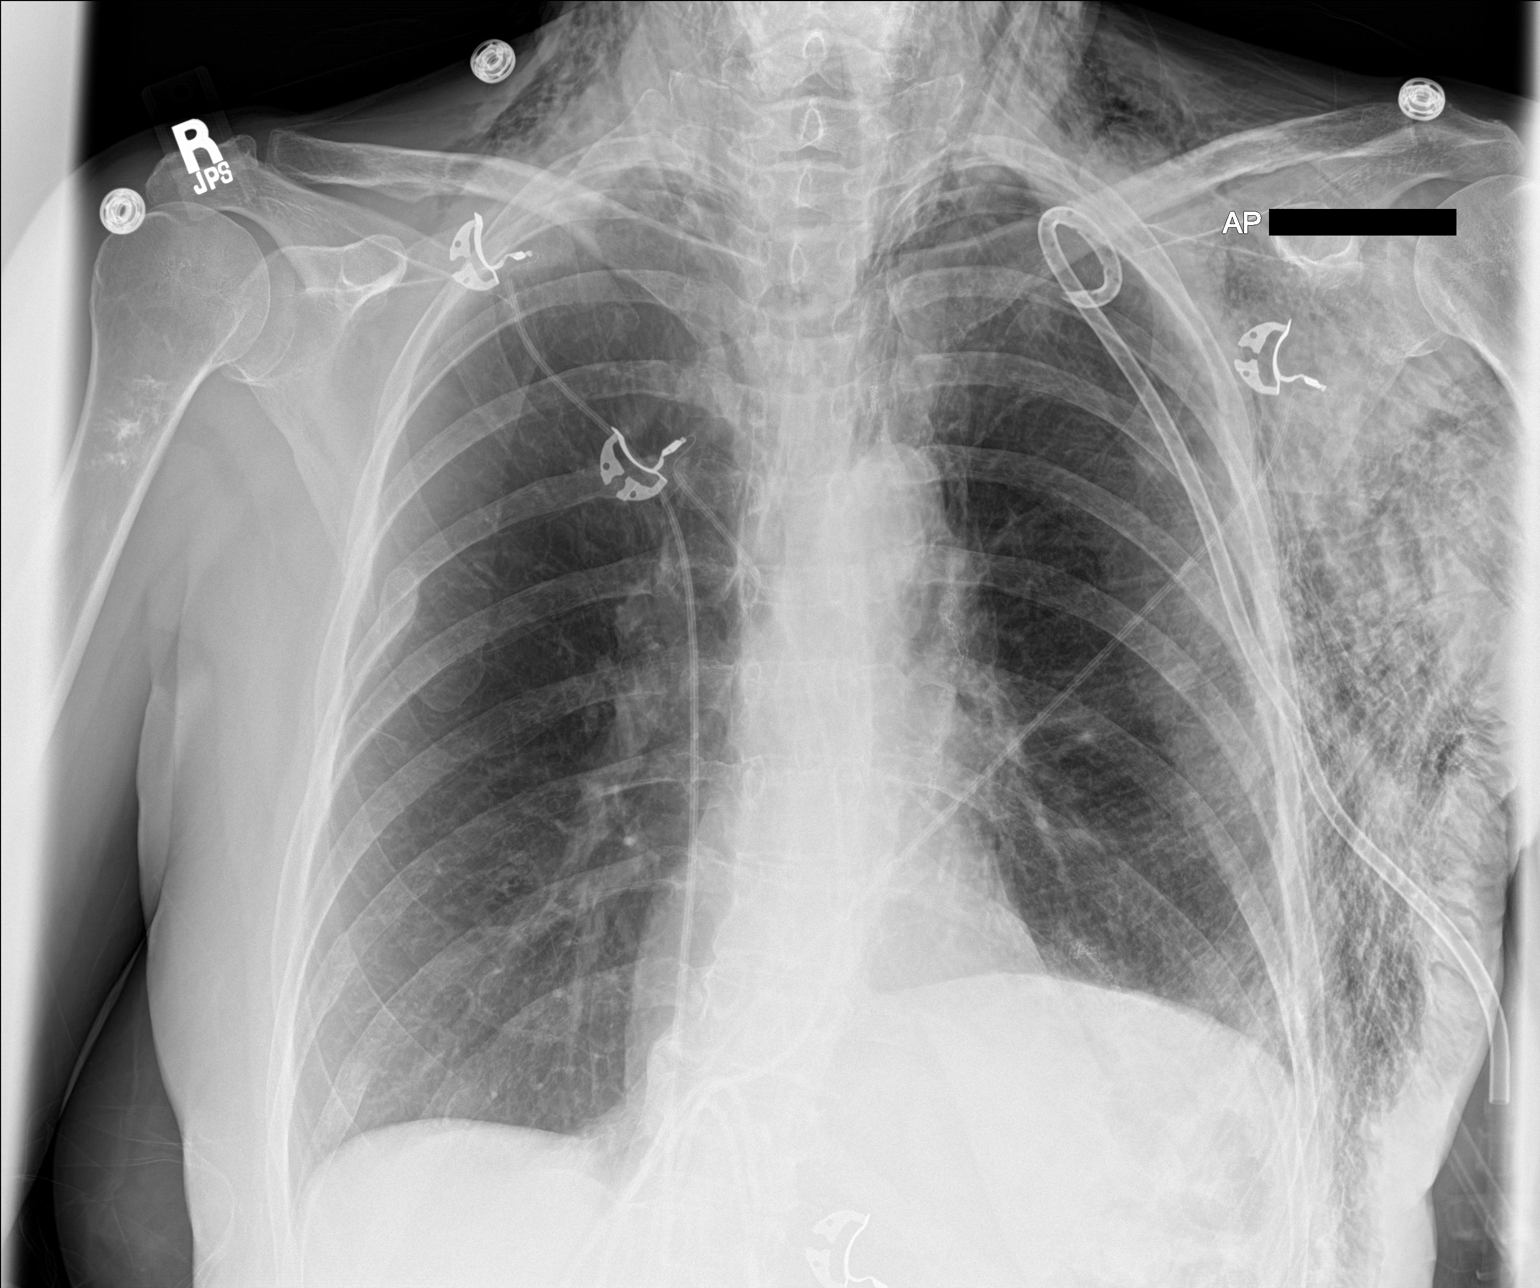

[1 of 1 positions shown; findings below may reference images not displayed]

FINDINGS: Pigtail chest tube is again identified on the left. The overall
appearance of the catheter is stable. The previously seen left
apical pneumothorax has resolved in the interval. Lungs are
otherwise clear. Considerable subcutaneous emphysema is noted on the
left with volume loss in the left lung. Cardiac shadow is stable. No
acute bony abnormality is noted.
IMPRESSION: Resolution of left-sided pneumothorax.

Chest tube remains in place on the left.

## 2019-03-22 NOTE — Telephone Encounter (Signed)
Scheduled per los. Mailed printout  °

## 2019-03-23 IMAGING — DX DG CHEST 1V PORT
1 series · 1 of 1 positions shown · non-contrast
Comparison: Interval chest x-ray of 02/22/2017

CLINICAL DATA: Post left upper lobectomy, follow-up, hoarseness

EXAM:
PORTABLE CHEST 1 VIEW

[chest]
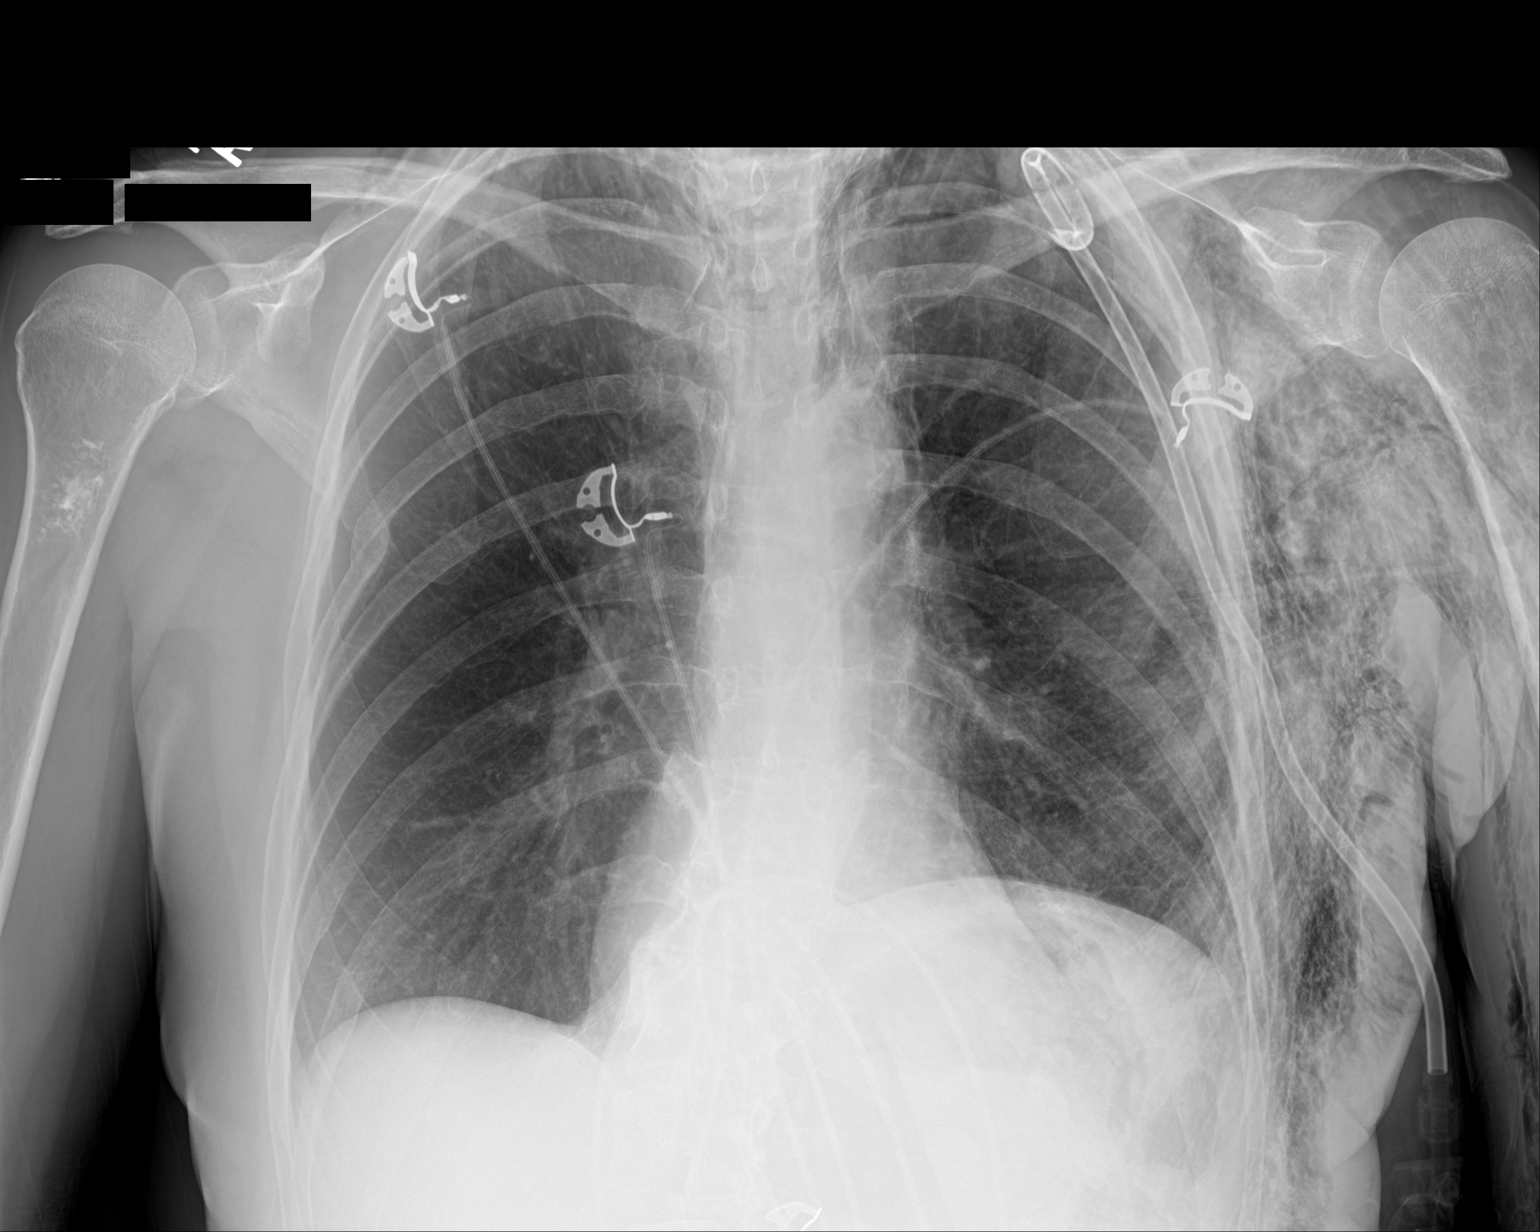

[1 of 1 positions shown; findings below may reference images not displayed]

FINDINGS: There is still a small left apical pneumothorax with left chest tube
remaining. Left chest wall subcutaneous air has not changed
significantly. The left hemidiaphragm remains elevated. The right
lung is clear. Heart size is stable.
IMPRESSION: Small left pneumothorax appears unchanged with left chest tube
remaining. Left chest wall subcutaneous air is unchanged.

## 2019-03-25 IMAGING — DX DG CHEST 1V PORT
1 series · 1 of 1 positions shown · non-contrast
Comparison: the previous day's study

CLINICAL DATA: Pneumothorax

EXAM:
PORTABLE CHEST - 1 VIEW

[chest]
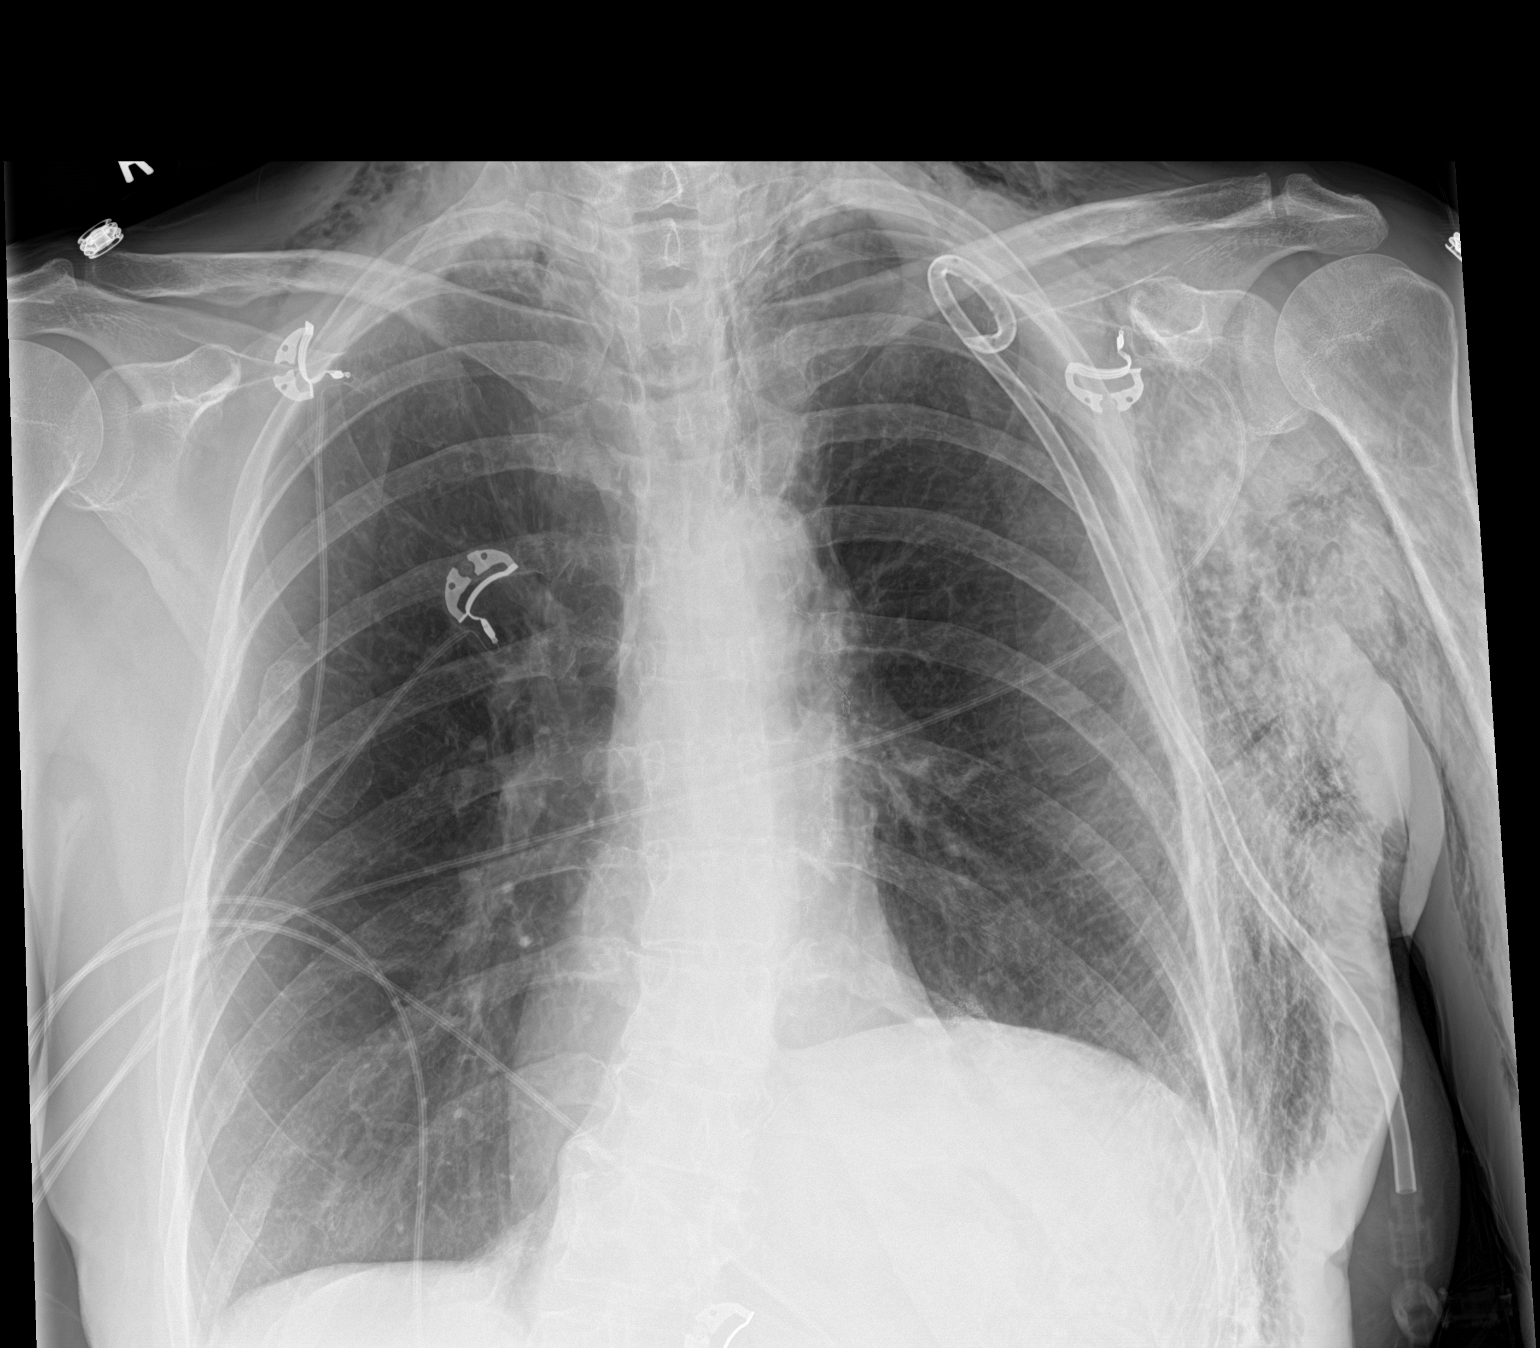

[1 of 1 positions shown; findings below may reference images not displayed]

FINDINGS: Pigtail left chest tube remains directed towards the left lung apex.
Slight improvement in small apical pneumothorax. Extensive left
lateral chest and bilateral neck subcutaneous emphysema as before.
Lungs are clear. Stable elevation of left diaphragmatic leaflet.
Postop changes at the left hilum.

Heart size and mediastinal contours are within normal limits. Aortic
Atherosclerosis ([id number]-170.0)

No effusion.

Old healed right rib fractures.
IMPRESSION: 1. Slight decrease in small left apical pneumothorax, with stable
chest tube position.

## 2019-04-10 ENCOUNTER — Encounter: Payer: BLUE CROSS/BLUE SHIELD | Admitting: Thoracic Surgery (Cardiothoracic Vascular Surgery)

## 2019-04-27 DIAGNOSIS — I10 Essential (primary) hypertension: Secondary | ICD-10-CM | POA: Diagnosis not present

## 2019-04-27 DIAGNOSIS — I700 Atherosclerosis of aorta: Secondary | ICD-10-CM | POA: Diagnosis not present

## 2019-04-27 DIAGNOSIS — E785 Hyperlipidemia, unspecified: Secondary | ICD-10-CM | POA: Diagnosis not present

## 2019-05-03 DIAGNOSIS — I700 Atherosclerosis of aorta: Secondary | ICD-10-CM | POA: Diagnosis not present

## 2019-05-03 DIAGNOSIS — I10 Essential (primary) hypertension: Secondary | ICD-10-CM | POA: Diagnosis not present

## 2019-05-03 DIAGNOSIS — E785 Hyperlipidemia, unspecified: Secondary | ICD-10-CM | POA: Diagnosis not present

## 2019-06-22 ENCOUNTER — Inpatient Hospital Stay: Payer: BC Managed Care – PPO | Attending: Internal Medicine

## 2019-06-22 ENCOUNTER — Encounter (HOSPITAL_COMMUNITY): Payer: Self-pay

## 2019-06-22 ENCOUNTER — Other Ambulatory Visit: Payer: Self-pay

## 2019-06-22 ENCOUNTER — Ambulatory Visit (HOSPITAL_COMMUNITY)
Admission: RE | Admit: 2019-06-22 | Discharge: 2019-06-22 | Disposition: A | Discharge status: Home / Self Care | Payer: BC Managed Care – PPO | Source: Ambulatory Visit | Attending: Internal Medicine | Admitting: Internal Medicine

## 2019-06-22 DIAGNOSIS — Z79899 Other long term (current) drug therapy: Secondary | ICD-10-CM | POA: Diagnosis not present

## 2019-06-22 DIAGNOSIS — Z791 Long term (current) use of non-steroidal anti-inflammatories (NSAID): Secondary | ICD-10-CM | POA: Diagnosis not present

## 2019-06-22 DIAGNOSIS — C3490 Malignant neoplasm of unspecified part of unspecified bronchus or lung: Secondary | ICD-10-CM | POA: Diagnosis not present

## 2019-06-22 DIAGNOSIS — I10 Essential (primary) hypertension: Secondary | ICD-10-CM | POA: Insufficient documentation

## 2019-06-22 DIAGNOSIS — Z902 Acquired absence of lung [part of]: Secondary | ICD-10-CM | POA: Insufficient documentation

## 2019-06-22 DIAGNOSIS — Z7951 Long term (current) use of inhaled steroids: Secondary | ICD-10-CM | POA: Diagnosis not present

## 2019-06-22 DIAGNOSIS — Z87891 Personal history of nicotine dependence: Secondary | ICD-10-CM | POA: Insufficient documentation

## 2019-06-22 DIAGNOSIS — J449 Chronic obstructive pulmonary disease, unspecified: Secondary | ICD-10-CM | POA: Diagnosis not present

## 2019-06-22 DIAGNOSIS — C3412 Malignant neoplasm of upper lobe, left bronchus or lung: Secondary | ICD-10-CM | POA: Insufficient documentation

## 2019-06-22 LAB — CBC WITH DIFFERENTIAL (CANCER CENTER ONLY)
Abs Immature Granulocytes: 0.01 10*3/uL (ref 0.00–0.07)
Basophils Absolute: 0 10*3/uL (ref 0.0–0.1)
Basophils Relative: 1 %
Eosinophils Absolute: 0.3 10*3/uL (ref 0.0–0.5)
Eosinophils Relative: 5 %
HCT: 41.1 % (ref 36.0–46.0)
Hemoglobin: 13.4 g/dL (ref 12.0–15.0)
Immature Granulocytes: 0 %
Lymphocytes Relative: 36 %
Lymphs Abs: 1.8 10*3/uL (ref 0.7–4.0)
MCH: 32.2 pg (ref 26.0–34.0)
MCHC: 32.6 g/dL (ref 30.0–36.0)
MCV: 98.8 fL (ref 80.0–100.0)
Monocytes Absolute: 0.4 10*3/uL (ref 0.1–1.0)
Monocytes Relative: 9 %
Neutro Abs: 2.5 10*3/uL (ref 1.7–7.7)
Neutrophils Relative %: 49 %
Platelet Count: 289 10*3/uL (ref 150–400)
RBC: 4.16 MIL/uL (ref 3.87–5.11)
RDW: 12.5 % (ref 11.5–15.5)
WBC Count: 5.1 10*3/uL (ref 4.0–10.5)
nRBC: 0 % (ref 0.0–0.2)

## 2019-06-22 LAB — CMP (CANCER CENTER ONLY)
ALT: 10 U/L (ref 0–44)
AST: 20 U/L (ref 15–41)
Albumin: 4.5 g/dL (ref 3.5–5.0)
Alkaline Phosphatase: 58 U/L (ref 38–126)
Anion gap: 10 (ref 5–15)
BUN: 15 mg/dL (ref 6–20)
CO2: 27 mmol/L (ref 22–32)
Calcium: 9.8 mg/dL (ref 8.9–10.3)
Chloride: 102 mmol/L (ref 98–111)
Creatinine: 1.07 mg/dL — ABNORMAL HIGH (ref 0.44–1.00)
GFR, Est AFR Am: >60 mL/min (ref >60)
GFR, Estimated: 57 mL/min — ABNORMAL LOW (ref >60)
Glucose, Bld: 90 mg/dL (ref 70–99)
Potassium: 4.4 mmol/L (ref 3.5–5.1)
Sodium: 139 mmol/L (ref 135–145)
Total Bilirubin: 0.5 mg/dL (ref 0.3–1.2)
Total Protein: 7.5 g/dL (ref 6.5–8.1)

## 2019-06-22 MED ORDER — SODIUM CHLORIDE (PF) 0.9 % IJ SOLN
INTRAMUSCULAR | Status: AC
  Filled 2019-06-22: qty 50

## 2019-06-22 MED ORDER — IOHEXOL 300 MG/ML  SOLN
75.0000 mL | Freq: Once | INTRAMUSCULAR | Status: AC | PRN
  Administered 2019-06-22: 75 mL via INTRAVENOUS

## 2019-06-25 ENCOUNTER — Inpatient Hospital Stay (HOSPITAL_BASED_OUTPATIENT_CLINIC_OR_DEPARTMENT_OTHER): Payer: BC Managed Care – PPO | Admitting: Physician Assistant

## 2019-06-25 ENCOUNTER — Encounter: Payer: Self-pay | Admitting: Physician Assistant

## 2019-06-25 ENCOUNTER — Other Ambulatory Visit: Payer: Self-pay

## 2019-06-25 VITALS — BP 128/82 | HR 98 | Temp 97.8°F | Resp 18 | Ht 62.0 in | Wt 127.3 lb

## 2019-06-25 DIAGNOSIS — Z7951 Long term (current) use of inhaled steroids: Secondary | ICD-10-CM | POA: Diagnosis not present

## 2019-06-25 DIAGNOSIS — I10 Essential (primary) hypertension: Secondary | ICD-10-CM | POA: Diagnosis not present

## 2019-06-25 DIAGNOSIS — J449 Chronic obstructive pulmonary disease, unspecified: Secondary | ICD-10-CM | POA: Diagnosis not present

## 2019-06-25 DIAGNOSIS — C3492 Malignant neoplasm of unspecified part of left bronchus or lung: Secondary | ICD-10-CM

## 2019-06-25 DIAGNOSIS — Z791 Long term (current) use of non-steroidal anti-inflammatories (NSAID): Secondary | ICD-10-CM | POA: Diagnosis not present

## 2019-06-25 DIAGNOSIS — Z87891 Personal history of nicotine dependence: Secondary | ICD-10-CM | POA: Diagnosis not present

## 2019-06-25 DIAGNOSIS — Z79899 Other long term (current) drug therapy: Secondary | ICD-10-CM | POA: Diagnosis not present

## 2019-06-25 DIAGNOSIS — C3412 Malignant neoplasm of upper lobe, left bronchus or lung: Secondary | ICD-10-CM | POA: Diagnosis not present

## 2019-06-25 DIAGNOSIS — Z902 Acquired absence of lung [part of]: Secondary | ICD-10-CM | POA: Diagnosis not present

## 2019-06-25 NOTE — Progress Notes (Signed)
[facility] OFFICE PROGRESS NOTE  Kristen Loader, FNP [place] [address]  DIAGNOSIS: Stage IB (T2 a, N0, M0) non-small cell lung cancer, moderately differentiated adenocarcinoma  PRIOR THERAPY: Status post left upper lobectomy with lymph node sampling on 02/15/2018 under the care of Dr. Roxan Hockey  CURRENT THERAPY: Observation  INTERVAL HISTORY: Aimee Lewis 58 y.o. female returns to the clinic for a follow-up visit. The patient is feeling well today without any concerning complaints.  She denies any fever, chills, night sweats, or weight loss.  She denies any chest pain, cough, or hemoptysis.  She denies any nausea, vomiting, diarrhea, or constipation.  She denies any headache or vision changes.  The patient is a former smoker, having quit in May 2019.  The patient follows with her PCP for her annual routine physicals/wellness visits, her most recent being in July 2020.  The patient recently had a repeat CT scan performed.  She is here today for evaluation and to discuss her scan results.  MEDICAL HISTORY: Past Medical History:  Diagnosis Date  . Chronic bronchitis ([facility])   . COPD (chronic obstructive pulmonary disease) ([place])   . Hypertension   . Lung cancer ([place]) dx'd 01/20/2018  . Pneumonia    "at least once" (02/21/2018)  . Pneumothorax 02/21/2018    ALLERGIES:  is allergic to codeine and darvon [propoxyphene].  MEDICATIONS:  Current Outpatient Medications  Medication Sig Dispense Refill  . lisinopril (PRINIVIL,ZESTRIL) 40 MG tablet Take 40 mg by mouth daily.    . pravastatin (PRAVACHOL) 20 MG tablet Take 1 tablet by mouth daily.    Marland Kitchen acetaminophen (TYLENOL) 500 MG tablet Take 2 tablets (1,000 mg total) by mouth every 6 (six) hours. (Patient not taking: Reported on 09/14/2018) 30 tablet 0  . Fluticasone-Salmeterol (ADVAIR) 100-50 MCG/DOSE AEPB Inhale 1 puff into the lungs daily as needed (shortness of breath).    Marland Kitchen ibuprofen  (ADVIL,MOTRIN) 200 MG tablet Take 400 mg by mouth daily as needed for headache or moderate pain.      No current facility-administered medications for this visit.     SURGICAL HISTORY:  Past Surgical History:  Procedure Laterality Date  . BACK SURGERY    . CATARACT EXTRACTION W/ INTRAOCULAR LENS  IMPLANT, BILATERAL Bilateral 09/2017  . CHEST TUBE INSERTION Left 02/15/2018; 02/21/2018  . LUMBAR DISC SURGERY    . TONSILLECTOMY    . VIDEO ASSISTED THORACOSCOPY (VATS)/ LOBECTOMY Left 02/15/2018   Procedure: VIDEO ASSISTED THORACOSCOPY (VATS)/LEFT UPPER LOBECTOMY;  Surgeon: Melrose Nakayama, MD;  Location: [facility];  Service: Thoracic;  Laterality: Left;  Marland Kitchen VIDEO BRONCHOSCOPY WITH ENDOBRONCHIAL NAVIGATION N/A 01/18/2018   Procedure: VIDEO BRONCHOSCOPY WITH ENDOBRONCHIAL NAVIGATION WITH BIOPSIES;  Surgeon: Collene Gobble, MD;  Location: [facility];  Service: Thoracic;  Laterality: N/A;    REVIEW OF SYSTEMS:   Review of Systems  Constitutional: Negative for appetite change, chills, fatigue, fever and unexpected weight change.  HENT:  Negative for mouth sores, nosebleeds, sore throat and trouble swallowing.   Eyes: Negative for eye problems and icterus.  Respiratory: Negative for cough, hemoptysis, shortness of breath and wheezing.   Cardiovascular: Negative for chest pain and leg swelling.  Gastrointestinal: Negative for abdominal pain, constipation, diarrhea, nausea and vomiting.  Genitourinary: Negative for bladder incontinence, difficulty urinating, dysuria, frequency and hematuria.   Musculoskeletal: Negative for back pain, gait problem, neck pain and neck stiffness.  Skin: Negative for itching and rash.  Neurological: Negative for dizziness, extremity weakness,  gait problem, headaches, light-headedness and seizures.  Hematological: Negative for adenopathy. Does not bruise/bleed easily.  Psychiatric/Behavioral: Negative for confusion, depression and sleep disturbance. The patient is not  nervous/anxious.     PHYSICAL EXAMINATION:  Blood pressure 128/82, pulse 98, temperature 97.8 F (36.6 C), temperature source Temporal, resp. rate 18, height 5\' 2"  (1.575 m), weight 127 lb 4.8 oz (57.7 kg), SpO2 100 %.  ECOG PERFORMANCE STATUS: 0 - Asymptomatic  Physical Exam  Constitutional: Oriented to person, place, and time and well-developed, well-nourished, and in no distress. No distress.  HENT:  Head: Normocephalic and atraumatic.  Mouth/Throat: Oropharynx is clear and moist. No oropharyngeal exudate.  Eyes: Conjunctivae are normal. Right eye exhibits no discharge. Left eye exhibits no discharge. No scleral icterus.  Neck: Normal range of motion. Neck supple.  Cardiovascular: Normal rate, regular rhythm, normal heart sounds and intact distal pulses.   Pulmonary/Chest: Effort normal and breath sounds normal but diminished in all lung fields. No respiratory distress. No wheezes. No rales.  Abdominal: Soft. Bowel sounds are normal. Exhibits no distension and no mass. There is no tenderness.  Musculoskeletal: Normal range of motion. Exhibits no edema.  Lymphadenopathy:    No cervical adenopathy.  Neurological: Alert and oriented to person, place, and time. Exhibits normal muscle tone. Gait normal. Coordination normal.  Skin: Skin is warm and dry. No rash noted. Not diaphoretic. No erythema. No pallor.  Psychiatric: Mood, memory and judgment normal.  Vitals reviewed.  LABORATORY DATA: Lab Results  Component Value Date   WBC 5.1 06/22/2019   HGB 13.4 06/22/2019   HCT 41.1 06/22/2019   MCV 98.8 06/22/2019   PLT 289 06/22/2019      Chemistry      Component Value Date/Time   NA 139 06/22/2019 0829   K 4.4 06/22/2019 0829   CL 102 06/22/2019 0829   CO2 27 06/22/2019 0829   BUN 15 06/22/2019 0829   CREATININE 1.07 (H) 06/22/2019 0829      Component Value Date/Time   CALCIUM 9.8 06/22/2019 0829   ALKPHOS 58 06/22/2019 0829   AST 20 06/22/2019 0829   ALT 10 06/22/2019  0829   BILITOT 0.5 06/22/2019 0829       RADIOGRAPHIC STUDIES:  Ct Chest W Contrast  Result Date: 06/22/2019 CLINICAL DATA:  Non-small-cell lung cancer. Moderately differentiated adenocarcinoma. Left upper lobectomy. EXAM: CT CHEST WITH CONTRAST TECHNIQUE: Multidetector CT imaging of the chest was performed during intravenous contrast administration. CONTRAST:  16mL OMNIPAQUE IOHEXOL 300 MG/ML  SOLN COMPARISON:  03/16/2019 FINDINGS: Cardiovascular: Aortic and branch vessel atherosclerosis. Normal heart size, without pericardial effusion. Multivessel coronary artery atherosclerosis. No central pulmonary embolism, on this non-dedicated study. Mediastinum/Nodes: No supraclavicular adenopathy. No mediastinal or hilar adenopathy. Lungs/Pleura: No pleural fluid. Right apical pleuroparenchymal scarring. Left upper lobectomy. Moderate centrilobular emphysema. Primarily resolution of previously described solid and subsolid pulmonary nodules. 2 adjacent 3 mm anterior right upper lobe pulmonary nodules on 76/5 are not significantly changed. Upper Abdomen: Normal imaged portions of the liver, spleen, stomach, pancreas, kidneys. Mild bilateral adrenal thickening. Abdominal aortic atherosclerosis. Musculoskeletal: Remote right rib trauma. Lower thoracic spondylosis. S-shaped thoracic spine curvature. IMPRESSION: 1. Status post left upper lobectomy, without evidence of metastatic disease. 2. The ground-glass and solid nodules described on the prior exam have primarily resolved. There is minimal inferior right upper lobe nodularity remaining. 3. Aortic atherosclerosis (ICD10-I70.0) and emphysema (ICD10-J43.9). 4. Age advanced coronary artery atherosclerosis. Recommend assessment of coronary risk factors and consideration of medical therapy. Electronically Signed  By: Abigail Miyamoto M.D.   On: 06/22/2019 11:02     ASSESSMENT/PLAN:  This is a very pleasant 58 year old Caucasian female with a history of stage Ib  non-small cell lung cancer, moderately differentiated adenocarcinoma.  She is status post a left upper lobeectomy with lymphnode dissection under the care of Dr. Roxan Hockey in May 2019.  The patient was seen Dr. Julien Nordmann today.  She is currently on observation she is feeling fine without any concerning complaints.    The patient recently had a follow up CT scan performed.  Dr. Julien Nordmann personally and independently reviewed the scan and discussed the results with the patient today.  The scan showed no evidence of disease progression/recurrence.  The areas of multifocal subsolid nodules in both lungs has resolved which was likely secondary to an inflammatory process.   Dr. Julien Nordmann recommends that the patient continue on observation with a repeat CT scan of the chest in 6 months.   We will see the patient back for a follow-up visit in 6 months for evaluation with a restaging CT scan of the chest.  The patient was advised to call immediately if she has any concerning symptoms in the interval. The patient voices understanding of current disease status and treatment options and is in agreement with the current care plan. All questions were answered. The patient knows to call the clinic with any problems, questions or concerns. We can certainly see the patient much sooner if necessary   Orders Placed This Encounter  Procedures  . CT Chest W Contrast    Standing Status:   Future    Standing Expiration Date:   06/24/2020    Order Specific Question:   ** REASON FOR EXAM (FREE TEXT)    Answer:   Restaging for history of lung cancer    Order Specific Question:   If indicated for the ordered procedure, I authorize the administration of contrast media per Radiology protocol    Answer:   Yes    Order Specific Question:   Is patient pregnant?    Answer:   No    Order Specific Question:   Preferred imaging location?    Answer:   [facility]    Order Specific Question:   Radiology Contrast Protocol  - do NOT remove file path    Answer:   [email or web address]  . CBC with Differential ([facility] Only)    Standing Status:   Future    Standing Expiration Date:   06/24/2020  . CMP ([facility] only)    Standing Status:   Future    Standing Expiration Date:   06/24/2020     Tobe Sos Jeri Jeanbaptiste, PA-C 06/25/19  ADDENDUM: Hematology/Oncology Attending: I had a face-to-face encounter with the patient.  I recommended her care plan.  This is a very pleasant 58 years old white female with a stage Ib non-small cell lung cancer, adenocarcinoma status post left upper lobectomy with lymph node dissection in May 2019. The patient is currently on observation and she is feeling fine with no concerning complaints. She had repeat CT scan of the chest performed recently.  I personally and independently reviewed the scans and discussed the results with the patient and her husband who was available by phone today. Her scan showed no concerning findings for disease recurrence or progression and there was resolution of the groundglass opacities seen on the previous scan. I recommended for the patient to continue on observation with repeat CT scan of the chest in 6  months. The patient was advised to call immediately if she has any concerning symptoms in the interval.  Disclaimer: This note was dictated with voice recognition software. Similar sounding words can inadvertently be transcribed and may be missed upon review. Eilleen Kempf, MD 06/25/19

## 2019-06-26 ENCOUNTER — Telehealth: Payer: Self-pay | Admitting: Physician Assistant

## 2019-06-26 NOTE — Telephone Encounter (Signed)
Scheduled appt per 9/21 los - mailed letter with appt date and time

## 2019-12-21 ENCOUNTER — Ambulatory Visit (HOSPITAL_COMMUNITY)
Admission: RE | Admit: 2019-12-21 | Discharge: 2019-12-21 | Disposition: A | Discharge status: Home / Self Care | Payer: BC Managed Care – PPO | Source: Ambulatory Visit | Attending: Physician Assistant | Admitting: Physician Assistant

## 2019-12-21 ENCOUNTER — Inpatient Hospital Stay: Payer: BC Managed Care – PPO | Attending: Internal Medicine

## 2019-12-21 ENCOUNTER — Other Ambulatory Visit: Payer: Self-pay

## 2019-12-21 DIAGNOSIS — C3492 Malignant neoplasm of unspecified part of left bronchus or lung: Secondary | ICD-10-CM | POA: Insufficient documentation

## 2019-12-21 DIAGNOSIS — Z791 Long term (current) use of non-steroidal anti-inflammatories (NSAID): Secondary | ICD-10-CM | POA: Insufficient documentation

## 2019-12-21 DIAGNOSIS — J439 Emphysema, unspecified: Secondary | ICD-10-CM | POA: Diagnosis not present

## 2019-12-21 DIAGNOSIS — Z7951 Long term (current) use of inhaled steroids: Secondary | ICD-10-CM | POA: Diagnosis not present

## 2019-12-21 DIAGNOSIS — I10 Essential (primary) hypertension: Secondary | ICD-10-CM | POA: Diagnosis not present

## 2019-12-21 DIAGNOSIS — J449 Chronic obstructive pulmonary disease, unspecified: Secondary | ICD-10-CM | POA: Insufficient documentation

## 2019-12-21 DIAGNOSIS — Z85118 Personal history of other malignant neoplasm of bronchus and lung: Secondary | ICD-10-CM | POA: Diagnosis not present

## 2019-12-21 DIAGNOSIS — Z902 Acquired absence of lung [part of]: Secondary | ICD-10-CM | POA: Insufficient documentation

## 2019-12-21 DIAGNOSIS — Z79899 Other long term (current) drug therapy: Secondary | ICD-10-CM | POA: Insufficient documentation

## 2019-12-21 LAB — CMP (CANCER CENTER ONLY)
ALT: 11 U/L (ref 0–44)
AST: 22 U/L (ref 15–41)
Albumin: 4.4 g/dL (ref 3.5–5.0)
Alkaline Phosphatase: 61 U/L (ref 38–126)
Anion gap: 11 (ref 5–15)
BUN: 13 mg/dL (ref 6–20)
CO2: 26 mmol/L (ref 22–32)
Calcium: 9.7 mg/dL (ref 8.9–10.3)
Chloride: 102 mmol/L (ref 98–111)
Creatinine: 0.91 mg/dL (ref 0.44–1.00)
GFR, Est AFR Am: >60 mL/min (ref >60)
GFR, Estimated: >60 mL/min (ref >60)
Glucose, Bld: 96 mg/dL (ref 70–99)
Potassium: 4.1 mmol/L (ref 3.5–5.1)
Sodium: 139 mmol/L (ref 135–145)
Total Bilirubin: 0.4 mg/dL (ref 0.3–1.2)
Total Protein: 7.7 g/dL (ref 6.5–8.1)

## 2019-12-21 LAB — CBC WITH DIFFERENTIAL (CANCER CENTER ONLY)
Abs Immature Granulocytes: 0.01 10*3/uL (ref 0.00–0.07)
Basophils Absolute: 0 10*3/uL (ref 0.0–0.1)
Basophils Relative: 1 %
Eosinophils Absolute: 0.2 10*3/uL (ref 0.0–0.5)
Eosinophils Relative: 3 %
HCT: 39.9 % (ref 36.0–46.0)
Hemoglobin: 13.2 g/dL (ref 12.0–15.0)
Immature Granulocytes: 0 %
Lymphocytes Relative: 34 %
Lymphs Abs: 2 10*3/uL (ref 0.7–4.0)
MCH: 32 pg (ref 26.0–34.0)
MCHC: 33.1 g/dL (ref 30.0–36.0)
MCV: 96.8 fL (ref 80.0–100.0)
Monocytes Absolute: 0.5 10*3/uL (ref 0.1–1.0)
Monocytes Relative: 8 %
Neutro Abs: 3 10*3/uL (ref 1.7–7.7)
Neutrophils Relative %: 54 %
Platelet Count: 270 10*3/uL (ref 150–400)
RBC: 4.12 MIL/uL (ref 3.87–5.11)
RDW: 12.6 % (ref 11.5–15.5)
WBC Count: 5.7 10*3/uL (ref 4.0–10.5)
nRBC: 0 % (ref 0.0–0.2)

## 2019-12-21 MED ORDER — SODIUM CHLORIDE (PF) 0.9 % IJ SOLN
INTRAMUSCULAR | Status: AC
  Filled 2019-12-21: qty 50

## 2019-12-21 MED ORDER — IOHEXOL 300 MG/ML  SOLN
100.0000 mL | Freq: Once | INTRAMUSCULAR | Status: AC | PRN
  Administered 2019-12-21: 100 mL via INTRAVENOUS

## 2019-12-24 ENCOUNTER — Inpatient Hospital Stay (HOSPITAL_BASED_OUTPATIENT_CLINIC_OR_DEPARTMENT_OTHER): Payer: BC Managed Care – PPO | Admitting: Internal Medicine

## 2019-12-24 ENCOUNTER — Encounter: Payer: Self-pay | Admitting: *Deleted

## 2019-12-24 ENCOUNTER — Other Ambulatory Visit: Payer: Self-pay

## 2019-12-24 ENCOUNTER — Encounter: Payer: Self-pay | Admitting: Internal Medicine

## 2019-12-24 VITALS — BP 128/77 | HR 84 | Temp 98.2°F | Resp 18 | Ht 62.0 in | Wt 133.6 lb

## 2019-12-24 DIAGNOSIS — Z791 Long term (current) use of non-steroidal anti-inflammatories (NSAID): Secondary | ICD-10-CM | POA: Diagnosis not present

## 2019-12-24 DIAGNOSIS — I10 Essential (primary) hypertension: Secondary | ICD-10-CM | POA: Diagnosis not present

## 2019-12-24 DIAGNOSIS — Z7951 Long term (current) use of inhaled steroids: Secondary | ICD-10-CM | POA: Diagnosis not present

## 2019-12-24 DIAGNOSIS — C3490 Malignant neoplasm of unspecified part of unspecified bronchus or lung: Secondary | ICD-10-CM

## 2019-12-24 DIAGNOSIS — C3492 Malignant neoplasm of unspecified part of left bronchus or lung: Secondary | ICD-10-CM | POA: Diagnosis not present

## 2019-12-24 DIAGNOSIS — J449 Chronic obstructive pulmonary disease, unspecified: Secondary | ICD-10-CM | POA: Diagnosis not present

## 2019-12-24 DIAGNOSIS — Z79899 Other long term (current) drug therapy: Secondary | ICD-10-CM | POA: Diagnosis not present

## 2019-12-24 DIAGNOSIS — Z902 Acquired absence of lung [part of]: Secondary | ICD-10-CM

## 2019-12-24 DIAGNOSIS — Z85118 Personal history of other malignant neoplasm of bronchus and lung: Secondary | ICD-10-CM | POA: Diagnosis not present

## 2019-12-24 NOTE — Progress Notes (Signed)
[facility] [phone]   [phone]  OFFICE PROGRESS NOTE  [place], [place] [address]  DIAGNOSIS: Stage IB (T2 a, N0, M0) non-small cell lung cancer, moderately differentiated adenocarcinoma  PRIOR THERAPY: status post left upper lobectomy with lymph node sampling on 02/15/2018 under the care of Dr. Roxan Hockey  CURRENT THERAPY: Observation.  INTERVAL HISTORY: Aimee Lewis 59 y.o. female returns to the clinic today for follow-up visit.  The patient is feeling fine today with no concerning complaints.  She received her Covid vaccine.  She denied having any current chest pain, shortness of breath, cough or hemoptysis.  She denied having any fever or chills.  She has no nausea, vomiting, diarrhea or constipation.  She denied having any headache or visual changes.  She has repeat CT scan of the chest performed recently and she is here for evaluation and discussion of her scan results.  MEDICAL HISTORY: Past Medical History:  Diagnosis Date  . Chronic bronchitis ([facility])   . COPD (chronic obstructive pulmonary disease) ([facility])   . Hypertension   . Lung cancer ([facility]) dx'd 01/20/2018  . Pneumonia    "at least once" (02/21/2018)  . Pneumothorax 02/21/2018    ALLERGIES:  is allergic to codeine and darvon [propoxyphene].  MEDICATIONS:  Current Outpatient Medications  Medication Sig Dispense Refill  . acetaminophen (TYLENOL) 500 MG tablet Take 2 tablets (1,000 mg total) by mouth every 6 (six) hours. (Patient not taking: Reported on 09/14/2018) 30 tablet 0  . Fluticasone-Salmeterol (ADVAIR) 100-50 MCG/DOSE AEPB Inhale 1 puff into the lungs daily as needed (shortness of breath).    Marland Kitchen ibuprofen (ADVIL,MOTRIN) 200 MG tablet Take 400 mg by mouth daily as needed for headache or moderate pain.     Marland Kitchen lisinopril (PRINIVIL,ZESTRIL) 40 MG tablet Take 40 mg by mouth daily.    . pravastatin (PRAVACHOL) 20 MG tablet Take 1 tablet  by mouth daily.     No current facility-administered medications for this visit.    SURGICAL HISTORY:  Past Surgical History:  Procedure Laterality Date  . BACK SURGERY    . CATARACT EXTRACTION W/ INTRAOCULAR LENS  IMPLANT, BILATERAL Bilateral 09/2017  . CHEST TUBE INSERTION Left 02/15/2018; 02/21/2018  . LUMBAR DISC SURGERY    . TONSILLECTOMY    . VIDEO ASSISTED THORACOSCOPY (VATS)/ LOBECTOMY Left 02/15/2018   Procedure: VIDEO ASSISTED THORACOSCOPY (VATS)/LEFT UPPER LOBECTOMY;  Surgeon: Melrose Nakayama, MD;  Location: [facility];  Service: Thoracic;  Laterality: Left;  Marland Kitchen VIDEO BRONCHOSCOPY WITH ENDOBRONCHIAL NAVIGATION N/A 01/18/2018   Procedure: VIDEO BRONCHOSCOPY WITH ENDOBRONCHIAL NAVIGATION WITH BIOPSIES;  Surgeon: Collene Gobble, MD;  Location: [facility];  Service: Thoracic;  Laterality: N/A;    REVIEW OF SYSTEMS:  A comprehensive review of systems was negative.   PHYSICAL EXAMINATION: General appearance: alert, cooperative and no distress Head: Normocephalic, without obvious abnormality, atraumatic Neck: no adenopathy, no JVD, supple, symmetrical, trachea midline and thyroid not enlarged, symmetric, no tenderness/mass/nodules Lymph nodes: Cervical, supraclavicular, and axillary nodes normal. Resp: clear to auscultation bilaterally Back: symmetric, no curvature. ROM normal. No CVA tenderness. Cardio: regular rate and rhythm, S1, S2 normal, no murmur, click, rub or gallop GI: soft, non-tender; bowel sounds normal; no masses,  no organomegaly Extremities: extremities normal, atraumatic, no cyanosis or edema  ECOG PERFORMANCE STATUS: 0 - Asymptomatic  Blood pressure 128/77, pulse 84, temperature 98.2 F (36.8 C), temperature source Oral, resp. rate 18, height 5\' 2"  (1.575  m), weight 133 lb 9.6 oz (60.6 kg), SpO2 99 %.  LABORATORY DATA: Lab Results  Component Value Date   WBC 5.7 12/21/2019   HGB 13.2 12/21/2019   HCT 39.9 12/21/2019   MCV 96.8 12/21/2019   PLT 270 12/21/2019        Chemistry      Component Value Date/Time   NA 139 12/21/2019 1018   K 4.1 12/21/2019 1018   CL 102 12/21/2019 1018   CO2 26 12/21/2019 1018   BUN 13 12/21/2019 1018   CREATININE 0.91 12/21/2019 1018      Component Value Date/Time   CALCIUM 9.7 12/21/2019 1018   ALKPHOS 61 12/21/2019 1018   AST 22 12/21/2019 1018   ALT 11 12/21/2019 1018   BILITOT 0.4 12/21/2019 1018       RADIOGRAPHIC STUDIES: CT Chest W Contrast  Result Date: 12/21/2019 CLINICAL DATA:  Restaging lung cancer. EXAM: CT CHEST WITH CONTRAST TECHNIQUE: Multidetector CT imaging of the chest was performed during intravenous contrast administration. CONTRAST:  111mL OMNIPAQUE IOHEXOL 300 MG/ML  SOLN COMPARISON:  06/22/2019 FINDINGS: Cardiovascular: The heart size appears within normal limits. Aortic atherosclerosis. Lad, left circumflex and RCA coronary artery calcifications noted. Mediastinum/Nodes: No enlarged mediastinal, hilar, or axillary lymph nodes. Thyroid gland, trachea, and esophagus demonstrate no significant findings. Lungs/Pleura: Moderate centrilobular emphysema identified. No airspace consolidation, atelectasis or pneumothorax. Left upper lobectomy. -Unchanged milli metric lung nodules within the anterior right upper lobe, image 73/5. -3 mm anterior left lung nodule is stable, image 33/5. -No new suspicious lung nodule. Upper Abdomen: No acute abnormality. Mild bilateral adrenal thickening. Aortic atherosclerosis. Musculoskeletal: Scoliosis and degenerative disc disease. No acute or suspicious osseous findings. IMPRESSION: 1. Stable CT of the chest. Status post left upper lobectomy. No findings to suggest local tumor recurrence or metastatic disease. 2. Emphysema and aortic atherosclerosis. 3. Multi vessel coronary artery atherosclerotic calcifications. Aortic Atherosclerosis (ICD10-I70.0) and Emphysema (ICD10-J43.9). Electronically Signed   By: Kerby Moors M.D.   On: 12/21/2019 14:09     ASSESSMENT AND  PLAN:  This is a very pleasant 59 years old white female with stage IB non-small cell lung cancer, moderately differentiated adenocarcinoma status post left upper lobectomy with lymph node dissection in May 2019 under the care of Dr. Roxan Hockey. The patient is currently on observation and she is feeling fine. She had repeat CT scan of the chest performed recently.  I personally and independently reviewed the scans and discussed the results with the patient today. Her scan showed no concerning findings for disease recurrence or metastasis. I recommended for her to continue on observation with repeat CT scan of the chest in 6 months. She was advised to call immediately if she has any concerning symptoms in the interval. The patient voices understanding of current disease status and treatment options and is in agreement with the current care plan.  All questions were answered. The patient knows to call the clinic with any problems, questions or concerns. We can certainly see the patient much sooner if necessary.   Disclaimer: This note was dictated with voice recognition software. Similar sounding words can inadvertently be transcribed and may not be corrected upon review.

## 2019-12-24 NOTE — Progress Notes (Signed)
Oncology Nurse Navigator Documentation  Oncology Nurse Navigator Flowsheets 12/24/2019  Abnormal Finding Date -  Confirmed Diagnosis Date -  Expected Surgery Date -  Navigator Location [facility]  Referral Date to RadOnc/MedOnc -  Navigator Encounter Type Clinic/MDC/I spoke with patient today at clinic.  She is doing well without complaints.  No barriers identified.   Telephone -  [facility] Date -  Multidisiplinary Clinic Type -  Treatment Initiated Date -  Patient Visit Type MedOnc  Treatment Phase Post-Tx Follow-up  Barriers/Navigation Needs No Barriers At This Time  Education -  Interventions Psycho-Social Support  Acuity Level 1-No Barriers  Coordination of Care -  Education Method -  Time Spent with Patient 15

## 2019-12-25 ENCOUNTER — Telehealth: Payer: Self-pay | Admitting: Internal Medicine

## 2019-12-25 NOTE — Telephone Encounter (Signed)
Scheduled per los. Called and spoke with patient. Confirmed appt 

## 2020-01-22 ENCOUNTER — Other Ambulatory Visit: Payer: Self-pay | Admitting: Family Medicine

## 2020-01-22 DIAGNOSIS — Z1231 Encounter for screening mammogram for malignant neoplasm of breast: Secondary | ICD-10-CM

## 2020-03-04 ENCOUNTER — Other Ambulatory Visit: Payer: Self-pay

## 2020-03-04 ENCOUNTER — Other Ambulatory Visit (HOSPITAL_COMMUNITY)
Admission: RE | Admit: 2020-03-04 | Discharge: 2020-03-04 | Disposition: A | Discharge status: Home / Self Care | Payer: BC Managed Care – PPO | Source: Ambulatory Visit | Attending: Family Medicine | Admitting: Family Medicine

## 2020-03-04 ENCOUNTER — Other Ambulatory Visit: Payer: Self-pay | Admitting: Family Medicine

## 2020-03-04 ENCOUNTER — Ambulatory Visit
Admission: RE | Admit: 2020-03-04 | Discharge: 2020-03-04 | Disposition: A | Discharge status: Home / Self Care | Payer: BC Managed Care – PPO | Source: Ambulatory Visit | Attending: Family Medicine | Admitting: Family Medicine

## 2020-03-04 DIAGNOSIS — Z1231 Encounter for screening mammogram for malignant neoplasm of breast: Secondary | ICD-10-CM | POA: Diagnosis not present

## 2020-03-04 DIAGNOSIS — E785 Hyperlipidemia, unspecified: Secondary | ICD-10-CM | POA: Diagnosis not present

## 2020-03-04 DIAGNOSIS — I10 Essential (primary) hypertension: Secondary | ICD-10-CM | POA: Diagnosis not present

## 2020-03-04 DIAGNOSIS — Z0000 Encounter for general adult medical examination without abnormal findings: Secondary | ICD-10-CM | POA: Diagnosis not present

## 2020-03-04 DIAGNOSIS — Z124 Encounter for screening for malignant neoplasm of cervix: Secondary | ICD-10-CM | POA: Diagnosis not present

## 2020-03-10 LAB — CYTOLOGY - PAP
Comment: NEGATIVE
Diagnosis: NEGATIVE
High risk HPV: NEGATIVE

## 2020-06-23 ENCOUNTER — Ambulatory Visit (HOSPITAL_COMMUNITY)
Admission: RE | Admit: 2020-06-23 | Discharge: 2020-06-23 | Disposition: A | Discharge status: Home / Self Care | Payer: BC Managed Care – PPO | Source: Ambulatory Visit | Attending: Internal Medicine | Admitting: Internal Medicine

## 2020-06-23 ENCOUNTER — Other Ambulatory Visit: Payer: Self-pay

## 2020-06-23 ENCOUNTER — Inpatient Hospital Stay: Payer: BC Managed Care – PPO | Attending: Internal Medicine

## 2020-06-23 DIAGNOSIS — I2510 Atherosclerotic heart disease of native coronary artery without angina pectoris: Secondary | ICD-10-CM | POA: Diagnosis not present

## 2020-06-23 DIAGNOSIS — Z85118 Personal history of other malignant neoplasm of bronchus and lung: Secondary | ICD-10-CM | POA: Diagnosis not present

## 2020-06-23 DIAGNOSIS — I700 Atherosclerosis of aorta: Secondary | ICD-10-CM | POA: Diagnosis not present

## 2020-06-23 DIAGNOSIS — C3490 Malignant neoplasm of unspecified part of unspecified bronchus or lung: Secondary | ICD-10-CM | POA: Diagnosis not present

## 2020-06-23 DIAGNOSIS — J439 Emphysema, unspecified: Secondary | ICD-10-CM | POA: Diagnosis not present

## 2020-06-23 LAB — CMP (CANCER CENTER ONLY)
ALT: 13 U/L (ref 0–44)
AST: 19 U/L (ref 15–41)
Albumin: 4.2 g/dL (ref 3.5–5.0)
Alkaline Phosphatase: 66 U/L (ref 38–126)
Anion gap: 10 (ref 5–15)
BUN: 14 mg/dL (ref 6–20)
CO2: 27 mmol/L (ref 22–32)
Calcium: 9.9 mg/dL (ref 8.9–10.3)
Chloride: 102 mmol/L (ref 98–111)
Creatinine: 0.93 mg/dL (ref 0.44–1.00)
GFR, Est AFR Am: >60 mL/min (ref >60)
GFR, Estimated: >60 mL/min (ref >60)
Glucose, Bld: 90 mg/dL (ref 70–99)
Potassium: 4.2 mmol/L (ref 3.5–5.1)
Sodium: 139 mmol/L (ref 135–145)
Total Bilirubin: 0.4 mg/dL (ref 0.3–1.2)
Total Protein: 7.9 g/dL (ref 6.5–8.1)

## 2020-06-23 LAB — CBC WITH DIFFERENTIAL (CANCER CENTER ONLY)
Abs Immature Granulocytes: 0.02 10*3/uL (ref 0.00–0.07)
Basophils Absolute: 0 10*3/uL (ref 0.0–0.1)
Basophils Relative: 1 %
Eosinophils Absolute: 0.3 10*3/uL (ref 0.0–0.5)
Eosinophils Relative: 4 %
HCT: 41.3 % (ref 36.0–46.0)
Hemoglobin: 13.5 g/dL (ref 12.0–15.0)
Immature Granulocytes: 0 %
Lymphocytes Relative: 31 %
Lymphs Abs: 1.9 10*3/uL (ref 0.7–4.0)
MCH: 32.1 pg (ref 26.0–34.0)
MCHC: 32.7 g/dL (ref 30.0–36.0)
MCV: 98.3 fL (ref 80.0–100.0)
Monocytes Absolute: 0.5 10*3/uL (ref 0.1–1.0)
Monocytes Relative: 8 %
Neutro Abs: 3.6 10*3/uL (ref 1.7–7.7)
Neutrophils Relative %: 56 %
Platelet Count: 299 10*3/uL (ref 150–400)
RBC: 4.2 MIL/uL (ref 3.87–5.11)
RDW: 12.6 % (ref 11.5–15.5)
WBC Count: 6.4 10*3/uL (ref 4.0–10.5)
nRBC: 0 % (ref 0.0–0.2)

## 2020-06-23 MED ORDER — IOHEXOL 300 MG/ML  SOLN
75.0000 mL | Freq: Once | INTRAMUSCULAR | Status: AC | PRN
  Administered 2020-06-23: 75 mL via INTRAVENOUS

## 2020-06-25 ENCOUNTER — Encounter: Payer: Self-pay | Admitting: Internal Medicine

## 2020-06-25 ENCOUNTER — Other Ambulatory Visit: Payer: Self-pay

## 2020-06-25 ENCOUNTER — Inpatient Hospital Stay (HOSPITAL_BASED_OUTPATIENT_CLINIC_OR_DEPARTMENT_OTHER): Payer: BC Managed Care – PPO | Admitting: Internal Medicine

## 2020-06-25 ENCOUNTER — Telehealth: Payer: Self-pay | Admitting: Internal Medicine

## 2020-06-25 VITALS — BP 134/68 | HR 83 | Temp 97.6°F | Resp 18 | Ht 62.0 in | Wt 134.9 lb

## 2020-06-25 DIAGNOSIS — Z85118 Personal history of other malignant neoplasm of bronchus and lung: Secondary | ICD-10-CM | POA: Diagnosis not present

## 2020-06-25 DIAGNOSIS — C3490 Malignant neoplasm of unspecified part of unspecified bronchus or lung: Secondary | ICD-10-CM

## 2020-06-25 DIAGNOSIS — C3492 Malignant neoplasm of unspecified part of left bronchus or lung: Secondary | ICD-10-CM

## 2020-06-25 DIAGNOSIS — Z01818 Encounter for other preprocedural examination: Secondary | ICD-10-CM | POA: Diagnosis not present

## 2020-06-25 NOTE — Progress Notes (Signed)
[facility] Telephone:(336) [phone]   Fax:(336) [phone]  OFFICE PROGRESS NOTE  [facility], [address] [place] [address]  DIAGNOSIS: Stage IB (T2 a, N0, M0) non-small cell lung cancer, moderately differentiated adenocarcinoma  PRIOR THERAPY: status post left upper lobectomy with lymph node sampling on 02/15/2018 under the care of Dr. Roxan Hockey  CURRENT THERAPY: Observation.  INTERVAL HISTORY: Aimee Lewis 59 y.o. female returns to the clinic today for 6 months follow-up visit.  The patient is feeling fine today with no concerning complaints except for shortness of breath with exertion but no significant chest pain, cough or hemoptysis.  She denied having any fever or chills.  She has no nausea, vomiting, diarrhea or constipation.  She denied having any headache or visual changes.  She is here today for evaluation with repeat CT scan of the chest for restaging of her disease.  MEDICAL HISTORY: Past Medical History:  Diagnosis Date  . Chronic bronchitis ([facility])   . COPD (chronic obstructive pulmonary disease) ([facility])   . Hypertension   . Lung cancer ([facility]) dx'd 01/20/2018  . Pneumonia    "at least once" (02/21/2018)  . Pneumothorax 02/21/2018    ALLERGIES:  is allergic to codeine and darvon [propoxyphene].  MEDICATIONS:  Current Outpatient Medications  Medication Sig Dispense Refill  . acetaminophen (TYLENOL) 500 MG tablet Take 2 tablets (1,000 mg total) by mouth every 6 (six) hours. (Patient not taking: Reported on 09/14/2018) 30 tablet 0  . Fluticasone-Salmeterol (ADVAIR) 100-50 MCG/DOSE AEPB Inhale 1 puff into the lungs daily as needed (shortness of breath).    Marland Kitchen ibuprofen (ADVIL,MOTRIN) 200 MG tablet Take 400 mg by mouth daily as needed for headache or moderate pain.     Marland Kitchen lisinopril (PRINIVIL,ZESTRIL) 40 MG tablet Take 40 mg by mouth daily.    . pravastatin (PRAVACHOL) 20 MG tablet Take 1 tablet by mouth daily.     No current  facility-administered medications for this visit.    SURGICAL HISTORY:  Past Surgical History:  Procedure Laterality Date  . BACK SURGERY    . CATARACT EXTRACTION W/ INTRAOCULAR LENS  IMPLANT, BILATERAL Bilateral 09/2017  . CHEST TUBE INSERTION Left 02/15/2018; 02/21/2018  . LUMBAR DISC SURGERY    . TONSILLECTOMY    . VIDEO ASSISTED THORACOSCOPY (VATS)/ LOBECTOMY Left 02/15/2018   Procedure: VIDEO ASSISTED THORACOSCOPY (VATS)/LEFT UPPER LOBECTOMY;  Surgeon: Melrose Nakayama, MD;  Location: [facility];  Service: Thoracic;  Laterality: Left;  Marland Kitchen VIDEO BRONCHOSCOPY WITH ENDOBRONCHIAL NAVIGATION N/A 01/18/2018   Procedure: VIDEO BRONCHOSCOPY WITH ENDOBRONCHIAL NAVIGATION WITH BIOPSIES;  Surgeon: Collene Gobble, MD;  Location[facility];  Service: Thoracic;  Laterality: N/A;    REVIEW OF SYSTEMS:  A comprehensive review of systems was negative except for: Respiratory: positive for dyspnea on exertion   PHYSICAL EXAMINATION: General appearance: alert, cooperative and no distress Head: Normocephalic, without obvious abnormality, atraumatic Neck: no adenopathy, no JVD, supple, symmetrical, trachea midline and thyroid not enlarged, symmetric, no tenderness/mass/nodules Lymph nodes: Cervical, supraclavicular, and axillary nodes normal. Resp: clear to auscultation bilaterally Back: symmetric, no curvature. ROM normal. No CVA tenderness. Cardio: regular rate and rhythm, S1, S2 normal, no murmur, click, rub or gallop GI: soft, non-tender; bowel sounds normal; no masses,  no organomegaly Extremities: extremities normal, atraumatic, no cyanosis or edema  ECOG PERFORMANCE STATUS: 0 - Asymptomatic  Blood pressure 134/68, pulse 83, temperature 97.6 F (36.4 C), temperature source Tympanic, resp. rate 18, height 5\' 2"  (  1.575 m), weight 134 lb 14.4 oz (61.2 kg), SpO2 100 %.  LABORATORY DATA: Lab Results  Component Value Date   WBC 6.4 06/23/2020   HGB 13.5 06/23/2020   HCT 41.3 06/23/2020   MCV 98.3  06/23/2020   PLT 299 06/23/2020      Chemistry      Component Value Date/Time   NA 139 06/23/2020 0923   K 4.2 06/23/2020 0923   CL 102 06/23/2020 0923   CO2 27 06/23/2020 0923   BUN 14 06/23/2020 0923   CREATININE 0.93 06/23/2020 0923      Component Value Date/Time   CALCIUM 9.9 06/23/2020 0923   ALKPHOS 66 06/23/2020 0923   AST 19 06/23/2020 0923   ALT 13 06/23/2020 0923   BILITOT 0.4 06/23/2020 0923       RADIOGRAPHIC STUDIES: CT Chest W Contrast  Result Date: 06/23/2020 CLINICAL DATA:  Non-small cell lung cancer staging, follow-up. EXAM: CT CHEST WITH CONTRAST TECHNIQUE: Multidetector CT imaging of the chest was performed during intravenous contrast administration. CONTRAST:  96mL OMNIPAQUE IOHEXOL 300 MG/ML  SOLN COMPARISON:  December 21, 2019 FINDINGS: Cardiovascular: Calcified and noncalcified atheromatous plaque in the thoracic aorta. No aneurysmal dilation. Calcified coronary artery disease in of LEFT and RIGHT coronary circulation similar to the previous study. Heart size is normal without pericardial effusion or signs of pericardial nodularity. Central pulmonary vasculature is unremarkable on venous phase assessment aside from distortion of LEFT hilum following LEFT upper lobectomy. Mediastinum/Nodes: Thoracic inlet structures are normal. No axillary lymphadenopathy. No mediastinal lymphadenopathy. No hilar lymphadenopathy. Lungs/Pleura: Post LEFT upper lobectomy. Mild emphysema at the RIGHT lung apex in particular but also throughout the chest in general. No effusion. No consolidation. Apical pleural scarring on the RIGHT with a similar appearance to the prior study. Tree-in-bud nodularity in the RIGHT upper lobe on image 67 of reconstructed series 100 is unchanged likely reflecting post inflammatory or post infectious process. Tiny pulmonary nodule in the RIGHT lower lobe on reconstructed image 80 of series 100 also without change. No new or suspicious pulmonary nodule. Airways  are distorted at the LEFT hilum but patent. Upper Abdomen: Incidental imaging of upper abdominal contents with stable area of low attenuation along the RIGHT hepatic margin on the last image of the data set, well incompletely imaged is not changed compared with imaging from June of 2020, likely a small cyst or hemangioma measuring approximately 9 mm. Adrenal glands are normal. No acute upper abdominal process to the extent evaluated. Musculoskeletal: Spinal degenerative changes. No acute or destructive bone process. IMPRESSION: 1. Post LEFT upper lobectomy. No evidence of recurrent or metastatic disease in the chest. 2. Calcified coronary artery disease in of LEFT and RIGHT coronary circulation similar to the previous study. 3. Pulmonary emphysema. 4. Aortic atherosclerosis. Aortic Atherosclerosis (ICD10-I70.0) and Emphysema (ICD10-J43.9). Electronically Signed   By: Zetta Bills M.D.   On: 06/23/2020 13:38     ASSESSMENT AND PLAN:  This is a very pleasant 59 years old white female with stage IB non-small cell lung cancer, moderately differentiated adenocarcinoma status post left upper lobectomy with lymph node dissection in May 2019 under the care of Dr. Roxan Hockey. The patient is currently on observation and she is feeling fine today with no concerning complaints. She had repeat CT scan of the chest performed recently.  I personally and independently reviewed the scans and discussed the results with the patient today. Her scan showed no concerning findings for disease recurrence or metastasis. I recommended for her  to continue on observation with repeat CT scan of the chest in 6 months. She was advised to call immediately if she has any concerning symptoms in the interval. The patient voices understanding of current disease status and treatment options and is in agreement with the current care plan.  All questions were answered. The patient knows to call the clinic with any problems, questions or  concerns. We can certainly see the patient much sooner if necessary.   Disclaimer: This note was dictated with voice recognition software. Similar sounding words can inadvertently be transcribed and may not be corrected upon review.

## 2020-06-25 NOTE — Telephone Encounter (Signed)
Scheduled appointments per 9/22 los. Spoke to patient in person who is aware of upcoming appointments.

## 2020-09-19 DIAGNOSIS — I10 Essential (primary) hypertension: Secondary | ICD-10-CM | POA: Diagnosis not present

## 2020-09-24 DIAGNOSIS — I10 Essential (primary) hypertension: Secondary | ICD-10-CM | POA: Diagnosis not present

## 2020-09-24 DIAGNOSIS — E785 Hyperlipidemia, unspecified: Secondary | ICD-10-CM | POA: Diagnosis not present

## 2020-09-25 DIAGNOSIS — Z1159 Encounter for screening for other viral diseases: Secondary | ICD-10-CM | POA: Diagnosis not present

## 2020-10-01 DIAGNOSIS — K640 First degree hemorrhoids: Secondary | ICD-10-CM | POA: Diagnosis not present

## 2020-10-01 DIAGNOSIS — K5520 Angiodysplasia of colon without hemorrhage: Secondary | ICD-10-CM | POA: Diagnosis not present

## 2020-10-01 DIAGNOSIS — Z1211 Encounter for screening for malignant neoplasm of colon: Secondary | ICD-10-CM | POA: Diagnosis not present

## 2020-11-24 DIAGNOSIS — I10 Essential (primary) hypertension: Secondary | ICD-10-CM | POA: Diagnosis not present

## 2020-11-24 DIAGNOSIS — H26493 Other secondary cataract, bilateral: Secondary | ICD-10-CM | POA: Diagnosis not present

## 2020-11-24 DIAGNOSIS — Z961 Presence of intraocular lens: Secondary | ICD-10-CM | POA: Diagnosis not present

## 2020-11-24 DIAGNOSIS — H26491 Other secondary cataract, right eye: Secondary | ICD-10-CM | POA: Diagnosis not present

## 2020-12-19 ENCOUNTER — Inpatient Hospital Stay: Payer: BC Managed Care – PPO | Attending: Internal Medicine

## 2020-12-19 ENCOUNTER — Encounter (HOSPITAL_COMMUNITY): Payer: Self-pay

## 2020-12-19 ENCOUNTER — Other Ambulatory Visit: Payer: Self-pay

## 2020-12-19 ENCOUNTER — Ambulatory Visit (HOSPITAL_COMMUNITY)
Admission: RE | Admit: 2020-12-19 | Discharge: 2020-12-19 | Disposition: A | Discharge status: Home / Self Care | Payer: BC Managed Care – PPO | Source: Ambulatory Visit | Attending: Internal Medicine | Admitting: Internal Medicine

## 2020-12-19 DIAGNOSIS — C3490 Malignant neoplasm of unspecified part of unspecified bronchus or lung: Secondary | ICD-10-CM

## 2020-12-19 DIAGNOSIS — I10 Essential (primary) hypertension: Secondary | ICD-10-CM | POA: Diagnosis not present

## 2020-12-19 DIAGNOSIS — Z79899 Other long term (current) drug therapy: Secondary | ICD-10-CM | POA: Insufficient documentation

## 2020-12-19 DIAGNOSIS — C3412 Malignant neoplasm of upper lobe, left bronchus or lung: Secondary | ICD-10-CM | POA: Diagnosis not present

## 2020-12-19 DIAGNOSIS — J449 Chronic obstructive pulmonary disease, unspecified: Secondary | ICD-10-CM | POA: Insufficient documentation

## 2020-12-19 DIAGNOSIS — I2510 Atherosclerotic heart disease of native coronary artery without angina pectoris: Secondary | ICD-10-CM | POA: Diagnosis not present

## 2020-12-19 DIAGNOSIS — J439 Emphysema, unspecified: Secondary | ICD-10-CM | POA: Diagnosis not present

## 2020-12-19 DIAGNOSIS — J984 Other disorders of lung: Secondary | ICD-10-CM | POA: Diagnosis not present

## 2020-12-19 LAB — CMP (CANCER CENTER ONLY)
ALT: 14 U/L (ref 0–44)
AST: 19 U/L (ref 15–41)
Albumin: 4.2 g/dL (ref 3.5–5.0)
Alkaline Phosphatase: 60 U/L (ref 38–126)
Anion gap: 8 (ref 5–15)
BUN: 19 mg/dL (ref 6–20)
CO2: 28 mmol/L (ref 22–32)
Calcium: 9.7 mg/dL (ref 8.9–10.3)
Chloride: 101 mmol/L (ref 98–111)
Creatinine: 1.07 mg/dL — ABNORMAL HIGH (ref 0.44–1.00)
GFR, Estimated: 59 mL/min — ABNORMAL LOW (ref >60)
Glucose, Bld: 92 mg/dL (ref 70–99)
Potassium: 4.9 mmol/L (ref 3.5–5.1)
Sodium: 137 mmol/L (ref 135–145)
Total Bilirubin: 0.3 mg/dL (ref 0.3–1.2)
Total Protein: 7.6 g/dL (ref 6.5–8.1)

## 2020-12-19 LAB — CBC WITH DIFFERENTIAL (CANCER CENTER ONLY)
Abs Immature Granulocytes: 0.01 10*3/uL (ref 0.00–0.07)
Basophils Absolute: 0.1 10*3/uL (ref 0.0–0.1)
Basophils Relative: 1 %
Eosinophils Absolute: 0.2 10*3/uL (ref 0.0–0.5)
Eosinophils Relative: 5 %
HCT: 40.8 % (ref 36.0–46.0)
Hemoglobin: 13.2 g/dL (ref 12.0–15.0)
Immature Granulocytes: 0 %
Lymphocytes Relative: 35 %
Lymphs Abs: 1.7 10*3/uL (ref 0.7–4.0)
MCH: 32.2 pg (ref 26.0–34.0)
MCHC: 32.4 g/dL (ref 30.0–36.0)
MCV: 99.5 fL (ref 80.0–100.0)
Monocytes Absolute: 0.5 10*3/uL (ref 0.1–1.0)
Monocytes Relative: 10 %
Neutro Abs: 2.4 10*3/uL (ref 1.7–7.7)
Neutrophils Relative %: 49 %
Platelet Count: 269 10*3/uL (ref 150–400)
RBC: 4.1 MIL/uL (ref 3.87–5.11)
RDW: 13.1 % (ref 11.5–15.5)
WBC Count: 4.9 10*3/uL (ref 4.0–10.5)
nRBC: 0 % (ref 0.0–0.2)

## 2020-12-19 MED ORDER — IOHEXOL 300 MG/ML  SOLN
100.0000 mL | Freq: Once | INTRAMUSCULAR | Status: AC | PRN
  Administered 2020-12-19: 75 mL via INTRAVENOUS

## 2020-12-22 ENCOUNTER — Other Ambulatory Visit: Payer: Self-pay

## 2020-12-22 ENCOUNTER — Inpatient Hospital Stay (HOSPITAL_BASED_OUTPATIENT_CLINIC_OR_DEPARTMENT_OTHER): Payer: BC Managed Care – PPO | Admitting: Internal Medicine

## 2020-12-22 ENCOUNTER — Telehealth: Payer: Self-pay | Admitting: Internal Medicine

## 2020-12-22 VITALS — BP 133/86 | HR 101 | Temp 98.5°F | Resp 18 | Ht 62.0 in | Wt 135.5 lb

## 2020-12-22 DIAGNOSIS — C3412 Malignant neoplasm of upper lobe, left bronchus or lung: Secondary | ICD-10-CM | POA: Diagnosis not present

## 2020-12-22 DIAGNOSIS — Z79899 Other long term (current) drug therapy: Secondary | ICD-10-CM | POA: Diagnosis not present

## 2020-12-22 DIAGNOSIS — C3492 Malignant neoplasm of unspecified part of left bronchus or lung: Secondary | ICD-10-CM | POA: Diagnosis not present

## 2020-12-22 DIAGNOSIS — I10 Essential (primary) hypertension: Secondary | ICD-10-CM | POA: Diagnosis not present

## 2020-12-22 DIAGNOSIS — C3490 Malignant neoplasm of unspecified part of unspecified bronchus or lung: Secondary | ICD-10-CM | POA: Diagnosis not present

## 2020-12-22 DIAGNOSIS — J449 Chronic obstructive pulmonary disease, unspecified: Secondary | ICD-10-CM | POA: Diagnosis not present

## 2020-12-22 NOTE — Telephone Encounter (Signed)
Scheduled per los. Gave avs and calendar  

## 2020-12-22 NOTE — Progress Notes (Signed)
[facility] Telephone:([phone]   Fax:(336) [phone]  OFFICE PROGRESS NOTE  [facility], [place] [address]  DIAGNOSIS: Stage IB (T2 a, N0, M0) non-small cell lung cancer, moderately differentiated adenocarcinoma  PRIOR THERAPY: status post left upper lobectomy with lymph node sampling on 02/15/2018 under the care of Dr. Roxan Hockey  CURRENT THERAPY: Observation.  INTERVAL HISTORY: Aimee Lewis 60 y.o. female returns to the clinic today for follow-up visit.  The patient is feeling fine today with no concerning complaints.  She denied having any current chest pain, shortness of breath, cough or hemoptysis.  She denied having any fever or chills.  She has no nausea, vomiting, diarrhea or constipation.  She has no headache or visual changes.  She denied having any significant weight loss or night sweats.  She had repeat CT scan of the chest performed recently and she is here for evaluation and discussion of her risk her results.  MEDICAL HISTORY: Past Medical History:  Diagnosis Date  . Chronic bronchitis ([facility])   . COPD (chronic obstructive pulmonary disease) ([facility])   . Hypertension   . Lung cancer ([facility]) dx'd 01/20/2018  . Pneumonia    "at least once" (02/21/2018)  . Pneumothorax 02/21/2018    ALLERGIES:  is allergic to codeine and darvon [propoxyphene].  MEDICATIONS:  Current Outpatient Medications  Medication Sig Dispense Refill  . Fluticasone-Salmeterol (ADVAIR) 100-50 MCG/DOSE AEPB Inhale 1 puff into the lungs daily as needed (shortness of breath).    Marland Kitchen ibuprofen (ADVIL,MOTRIN) 200 MG tablet Take 400 mg by mouth daily as needed for headache or moderate pain.     Marland Kitchen lisinopril (PRINIVIL,ZESTRIL) 40 MG tablet Take 40 mg by mouth daily.    . pravastatin (PRAVACHOL) 40 MG tablet Take 40 mg by mouth daily.     No current facility-administered medications for this visit.    SURGICAL HISTORY:  Past Surgical History:   Procedure Laterality Date  . BACK SURGERY    . CATARACT EXTRACTION W/ INTRAOCULAR LENS  IMPLANT, BILATERAL Bilateral 09/2017  . CHEST TUBE INSERTION Left 02/15/2018; 02/21/2018  . LUMBAR DISC SURGERY    . TONSILLECTOMY    . VIDEO ASSISTED THORACOSCOPY (VATS)/ LOBECTOMY Left 02/15/2018   Procedure: VIDEO ASSISTED THORACOSCOPY (VATS)/LEFT UPPER LOBECTOMY;  Surgeon: Melrose Nakayama, MD;  Location: [facility];  Service: Thoracic;  Laterality: Left;  Marland Kitchen VIDEO BRONCHOSCOPY WITH ENDOBRONCHIAL NAVIGATION N/A 01/18/2018   Procedure: VIDEO BRONCHOSCOPY WITH ENDOBRONCHIAL NAVIGATION WITH BIOPSIES;  Surgeon: Collene Gobble, MD;  [facility];  Service: Thoracic;  Laterality: N/A;    REVIEW OF SYSTEMS:  A comprehensive review of systems was negative.   PHYSICAL EXAMINATION: General appearance: alert, cooperative and no distress Head: Normocephalic, without obvious abnormality, atraumatic Neck: no adenopathy, no JVD, supple, symmetrical, trachea midline and thyroid not enlarged, symmetric, no tenderness/mass/nodules Lymph nodes: Cervical, supraclavicular, and axillary nodes normal. Resp: clear to auscultation bilaterally Back: symmetric, no curvature. ROM normal. No CVA tenderness. Cardio: regular rate and rhythm, S1, S2 normal, no murmur, click, rub or gallop GI: soft, non-tender; bowel sounds normal; no masses,  no organomegaly Extremities: extremities normal, atraumatic, no cyanosis or edema  ECOG PERFORMANCE STATUS: 0 - Asymptomatic  Blood pressure 133/86, pulse (!) 101, temperature 98.5 F (36.9 C), temperature source Oral, resp. rate 18, height 5\' 2"  (1.575 m), weight 135 lb 8 oz (61.5 kg), SpO2 100 %.  LABORATORY DATA: Lab Results  Component Value Date  WBC 4.9 12/19/2020   HGB 13.2 12/19/2020   HCT 40.8 12/19/2020   MCV 99.5 12/19/2020   PLT 269 12/19/2020      Chemistry      Component Value Date/Time   NA 137 12/19/2020 0744   K 4.9 12/19/2020 0744   CL 101 12/19/2020 0744    CO2 28 12/19/2020 0744   BUN 19 12/19/2020 0744   CREATININE 1.07 (H) 12/19/2020 0744      Component Value Date/Time   CALCIUM 9.7 12/19/2020 0744   ALKPHOS 60 12/19/2020 0744   AST 19 12/19/2020 0744   ALT 14 12/19/2020 0744   BILITOT 0.3 12/19/2020 0744       RADIOGRAPHIC STUDIES: CT Chest W Contrast  Result Date: 12/19/2020 CLINICAL DATA:  Primary Cancer Type: Lung Imaging Indication: Routine surveillance Interval therapy since last imaging? No Initial Cancer Diagnosis Date: 02/15/2018; Established by: Biopsy-proven Detailed Pathology: Stage IB non-small cell lung cancer, moderately differentiated adenocarcinoma. Primary Tumor location: Left upper lobe. Surgeries: Left upper lobectomy 02/15/2018. Chemotherapy: No Immunotherapy? No Radiation therapy? No EXAM: CT CHEST WITH CONTRAST TECHNIQUE: Multidetector CT imaging of the chest was performed during intravenous contrast administration. CONTRAST:  60mL OMNIPAQUE IOHEXOL 300 MG/ML  SOLN COMPARISON:  Most recent CT chest 06/23/2020.  12/26/2017 PET-CT. FINDINGS: Cardiovascular: The heart size is normal. No substantial pericardial effusion. Coronary artery calcification is evident. Atherosclerotic calcification is noted in the wall of the thoracic aorta. Mediastinum/Nodes: No mediastinal lymphadenopathy. There is no hilar lymphadenopathy. The esophagus has normal imaging features. There is no axillary lymphadenopathy. Lungs/Pleura: Centrilobular emphsyema noted. Right greater than left pleuroparenchymal scarring noted in the apices. Tiny focus of tree-in-bud opacity in the right upper lobe is stable (74/7). 4 mm posterior right middle lobe nodule on 129/7 is new in the interval. No focal airspace consolidation. No pleural effusion. Several scattered tiny pulmonary nodules are similar to prior. No new suspicious pulmonary nodule or mass. Upper Abdomen: 13 mm subcapsular lesion inferior right liver was not included on the prior study but is stable  comparing back to a CT chest from 03/16/2019. This has peripheral nodular enhancement on today's exam most suggestive of benign cavernous hemangioma. Unremarkable Musculoskeletal: No worrisome lytic or sclerotic osseous abnormality. IMPRESSION: 1. No definite findings today to suggest recurrent or metastatic disease. 2. Interval development of a 4 mm posterior right middle lobe pulmonary nodule. Likely infectious/inflammatory, attention on follow-up recommended. 3.  Emphysema (ICD10-J43.9) and Aortic Atherosclerosis (ICD10-170.0) Electronically Signed   By: Misty Stanley M.D.   On: 12/19/2020 10:42     ASSESSMENT AND PLAN:  This is a very pleasant 60 years old white female with stage IB non-small cell lung cancer, moderately differentiated adenocarcinoma status post left upper lobectomy with lymph node dissection in May 2019 under the care of Dr. Roxan Hockey. The patient is currently on observation and she is feeling fine today with no concerning complaints. She had repeat CT scan of the chest performed recently that showed no evidence for disease recurrence or metastasis but there was interval development of 4 mm posterior right middle lobe pulmonary nodule likely inflammatory but need close attention. I recommended for the patient to have repeat CT scan of the chest in 6 months.  If the upcoming scan showed no concerning findings for recurrence or metastasis, I will start seeing her on an annual basis after that. She was advised to call immediately if she has any other concerning symptoms in the interval. The patient voices understanding of current disease status  and treatment options and is in agreement with the current care plan.  All questions were answered. The patient knows to call the clinic with any problems, questions or concerns. We can certainly see the patient much sooner if necessary.   Disclaimer: This note was dictated with voice recognition software. Similar sounding words can  inadvertently be transcribed and may not be corrected upon review.

## 2020-12-31 DIAGNOSIS — H26492 Other secondary cataract, left eye: Secondary | ICD-10-CM | POA: Diagnosis not present

## 2021-06-02 DIAGNOSIS — E785 Hyperlipidemia, unspecified: Secondary | ICD-10-CM | POA: Diagnosis not present

## 2021-06-02 DIAGNOSIS — Z23 Encounter for immunization: Secondary | ICD-10-CM | POA: Diagnosis not present

## 2021-06-02 DIAGNOSIS — I10 Essential (primary) hypertension: Secondary | ICD-10-CM | POA: Diagnosis not present

## 2021-06-02 DIAGNOSIS — R252 Cramp and spasm: Secondary | ICD-10-CM | POA: Diagnosis not present

## 2021-06-02 DIAGNOSIS — Z0000 Encounter for general adult medical examination without abnormal findings: Secondary | ICD-10-CM | POA: Diagnosis not present

## 2021-06-02 DIAGNOSIS — J449 Chronic obstructive pulmonary disease, unspecified: Secondary | ICD-10-CM | POA: Diagnosis not present

## 2021-06-22 ENCOUNTER — Inpatient Hospital Stay: Payer: BC Managed Care – PPO | Attending: Internal Medicine

## 2021-06-22 ENCOUNTER — Encounter (HOSPITAL_COMMUNITY): Payer: Self-pay

## 2021-06-22 ENCOUNTER — Other Ambulatory Visit: Payer: Self-pay

## 2021-06-22 ENCOUNTER — Ambulatory Visit (HOSPITAL_COMMUNITY)
Admission: RE | Admit: 2021-06-22 | Discharge: 2021-06-22 | Disposition: A | Discharge status: Home / Self Care | Payer: BC Managed Care – PPO | Source: Ambulatory Visit | Attending: Internal Medicine | Admitting: Internal Medicine

## 2021-06-22 DIAGNOSIS — C3490 Malignant neoplasm of unspecified part of unspecified bronchus or lung: Secondary | ICD-10-CM

## 2021-06-22 DIAGNOSIS — Z85118 Personal history of other malignant neoplasm of bronchus and lung: Secondary | ICD-10-CM | POA: Insufficient documentation

## 2021-06-22 DIAGNOSIS — R911 Solitary pulmonary nodule: Secondary | ICD-10-CM | POA: Diagnosis not present

## 2021-06-22 DIAGNOSIS — Z902 Acquired absence of lung [part of]: Secondary | ICD-10-CM | POA: Diagnosis not present

## 2021-06-22 DIAGNOSIS — I700 Atherosclerosis of aorta: Secondary | ICD-10-CM | POA: Diagnosis not present

## 2021-06-22 DIAGNOSIS — J439 Emphysema, unspecified: Secondary | ICD-10-CM | POA: Diagnosis not present

## 2021-06-22 LAB — CBC WITH DIFFERENTIAL (CANCER CENTER ONLY)
Abs Immature Granulocytes: 0.01 10*3/uL (ref 0.00–0.07)
Basophils Absolute: 0.1 10*3/uL (ref 0.0–0.1)
Basophils Relative: 1 %
Eosinophils Absolute: 0.2 10*3/uL (ref 0.0–0.5)
Eosinophils Relative: 5 %
HCT: 38.4 % (ref 36.0–46.0)
Hemoglobin: 13 g/dL (ref 12.0–15.0)
Immature Granulocytes: 0 %
Lymphocytes Relative: 31 %
Lymphs Abs: 1.6 10*3/uL (ref 0.7–4.0)
MCH: 32.3 pg (ref 26.0–34.0)
MCHC: 33.9 g/dL (ref 30.0–36.0)
MCV: 95.3 fL (ref 80.0–100.0)
Monocytes Absolute: 0.4 10*3/uL (ref 0.1–1.0)
Monocytes Relative: 8 %
Neutro Abs: 2.9 10*3/uL (ref 1.7–7.7)
Neutrophils Relative %: 55 %
Platelet Count: 333 10*3/uL (ref 150–400)
RBC: 4.03 MIL/uL (ref 3.87–5.11)
RDW: 12.1 % (ref 11.5–15.5)
WBC Count: 5.1 10*3/uL (ref 4.0–10.5)
nRBC: 0 % (ref 0.0–0.2)

## 2021-06-22 LAB — CMP (CANCER CENTER ONLY)
ALT: 17 U/L (ref 0–44)
AST: 26 U/L (ref 15–41)
Albumin: 4.5 g/dL (ref 3.5–5.0)
Alkaline Phosphatase: 60 U/L (ref 38–126)
Anion gap: 12 (ref 5–15)
BUN: 15 mg/dL (ref 8–23)
CO2: 25 mmol/L (ref 22–32)
Calcium: 10 mg/dL (ref 8.9–10.3)
Chloride: 94 mmol/L — ABNORMAL LOW (ref 98–111)
Creatinine: 0.98 mg/dL (ref 0.44–1.00)
GFR, Estimated: >60 mL/min (ref >60)
Glucose, Bld: 94 mg/dL (ref 70–99)
Potassium: 5 mmol/L (ref 3.5–5.1)
Sodium: 131 mmol/L — ABNORMAL LOW (ref 135–145)
Total Bilirubin: 0.6 mg/dL (ref 0.3–1.2)
Total Protein: 7.7 g/dL (ref 6.5–8.1)

## 2021-06-22 MED ORDER — IOHEXOL 350 MG/ML SOLN
75.0000 mL | Freq: Once | INTRAVENOUS | Status: AC | PRN
  Administered 2021-06-22: 75 mL via INTRAVENOUS

## 2021-06-24 ENCOUNTER — Other Ambulatory Visit: Payer: Self-pay

## 2021-06-24 ENCOUNTER — Telehealth: Payer: Self-pay | Admitting: Internal Medicine

## 2021-06-24 ENCOUNTER — Inpatient Hospital Stay (HOSPITAL_BASED_OUTPATIENT_CLINIC_OR_DEPARTMENT_OTHER): Payer: BC Managed Care – PPO | Admitting: Internal Medicine

## 2021-06-24 VITALS — BP 114/65 | HR 84 | Temp 97.2°F | Resp 18 | Wt 135.1 lb

## 2021-06-24 DIAGNOSIS — C3490 Malignant neoplasm of unspecified part of unspecified bronchus or lung: Secondary | ICD-10-CM

## 2021-06-24 DIAGNOSIS — Z902 Acquired absence of lung [part of]: Secondary | ICD-10-CM | POA: Diagnosis not present

## 2021-06-24 DIAGNOSIS — Z85118 Personal history of other malignant neoplasm of bronchus and lung: Secondary | ICD-10-CM | POA: Diagnosis not present

## 2021-06-24 NOTE — Telephone Encounter (Signed)
Scheduled appt per 9/21 los - mailed letter with appt date and time

## 2021-06-24 NOTE — Progress Notes (Signed)
[facility] Telephone:(336) [phone]   Fax:(336) [phone]  OFFICE PROGRESS NOTE  [facility], [place] [address]  DIAGNOSIS: Stage IB (T2 a, N0, M0) non-small cell lung cancer, moderately differentiated adenocarcinoma  PRIOR THERAPY: Status post left upper lobectomy with lymph node sampling on 02/15/2018 under the care of Dr. Roxan Hockey  CURRENT THERAPY: Observation.  INTERVAL HISTORY: Aimee Lewis 60 y.o. female returns to the clinic today for follow-up visit.  The patient is feeling fine today with no concerning complaints.  She denied having any current chest pain, shortness of breath, cough or hemoptysis.  She denied having any fever or chills.  She has no nausea, vomiting, diarrhea or constipation.  She denied having any headache or visual changes.  She has no recent weight loss or night sweats.  She is here today for evaluation with repeat CT scan of the chest for restaging of her disease.  MEDICAL HISTORY: Past Medical History:  Diagnosis Date   Chronic bronchitis ([facility])    COPD (chronic obstructive pulmonary disease) ([facility])    Hypertension    Lung cancer ([facility]) dx'd 01/20/2018   Pneumonia    "at least once" (02/21/2018)   Pneumothorax 02/21/2018    ALLERGIES:  is allergic to codeine and darvon [propoxyphene].  MEDICATIONS:  Current Outpatient Medications  Medication Sig Dispense Refill   Fluticasone-Salmeterol (ADVAIR) 100-50 MCG/DOSE AEPB Inhale 1 puff into the lungs daily as needed (shortness of breath).     ibuprofen (ADVIL,MOTRIN) 200 MG tablet Take 400 mg by mouth daily as needed for headache or moderate pain.      lisinopril (PRINIVIL,ZESTRIL) 40 MG tablet Take 40 mg by mouth daily.     pravastatin (PRAVACHOL) 40 MG tablet Take 40 mg by mouth daily.     No current facility-administered medications for this visit.    SURGICAL HISTORY:  Past Surgical History:  Procedure Laterality Date   BACK SURGERY      CATARACT EXTRACTION W/ INTRAOCULAR LENS  IMPLANT, BILATERAL Bilateral 09/2017   CHEST TUBE INSERTION Left 02/15/2018; 02/21/2018   LUMBAR DISC SURGERY     TONSILLECTOMY     VIDEO ASSISTED THORACOSCOPY (VATS)/ LOBECTOMY Left 02/15/2018   Procedure: VIDEO ASSISTED THORACOSCOPY (VATS)/LEFT UPPER LOBECTOMY;  Surgeon: Melrose Nakayama, MD;  Location: [facility];  Service: Thoracic;  Laterality: Left;   VIDEO BRONCHOSCOPY WITH ENDOBRONCHIAL NAVIGATION N/A 01/18/2018   Procedure: VIDEO BRONCHOSCOPY WITH ENDOBRONCHIAL NAVIGATION WITH BIOPSIES;  Surgeon: Collene Gobble, MD;  [facility];  Service: Thoracic;  Laterality: N/A;    REVIEW OF SYSTEMS:  A comprehensive review of systems was negative.   PHYSICAL EXAMINATION: General appearance: alert, cooperative, and no distress Head: Normocephalic, without obvious abnormality, atraumatic Neck: no adenopathy, no JVD, supple, symmetrical, trachea midline, and thyroid not enlarged, symmetric, no tenderness/mass/nodules Lymph nodes: Cervical, supraclavicular, and axillary nodes normal. Resp: clear to auscultation bilaterally Back: symmetric, no curvature. ROM normal. No CVA tenderness. Cardio: regular rate and rhythm, S1, S2 normal, no murmur, click, rub or gallop GI: soft, non-tender; bowel sounds normal; no masses,  no organomegaly Extremities: extremities normal, atraumatic, no cyanosis or edema  ECOG PERFORMANCE STATUS: 0 - Asymptomatic  Blood pressure 114/65, pulse 84, temperature (!) 97.2 F (36.2 C), temperature source Tympanic, resp. rate 18, weight 135 lb 1.6 oz (61.3 kg), SpO2 99 %.  LABORATORY DATA: Lab Results  Component Value Date   WBC 5.1 06/22/2021   HGB 13.0 06/22/2021  HCT 38.4 06/22/2021   MCV 95.3 06/22/2021   PLT 333 06/22/2021      Chemistry      Component Value Date/Time   NA 131 (L) 06/22/2021 0745   K 5.0 06/22/2021 0745   CL 94 (L) 06/22/2021 0745   CO2 25 06/22/2021 0745   BUN 15 06/22/2021 0745   CREATININE  0.98 06/22/2021 0745      Component Value Date/Time   CALCIUM 10.0 06/22/2021 0745   ALKPHOS 60 06/22/2021 0745   AST 26 06/22/2021 0745   ALT 17 06/22/2021 0745   BILITOT 0.6 06/22/2021 0745       RADIOGRAPHIC STUDIES: CT Chest W Contrast  Result Date: 06/22/2021 CLINICAL DATA:  Stage I B left upper lobe non-small cell lung cancer status post left upper lobectomy 02/15/2018. Interval observation. Restaging. EXAM: CT CHEST WITH CONTRAST TECHNIQUE: Multidetector CT imaging of the chest was performed during intravenous contrast administration. CONTRAST:  86mL OMNIPAQUE IOHEXOL 350 MG/ML SOLN COMPARISON:  12/19/2020 chest CT. FINDINGS: Cardiovascular: Normal heart size. No significant pericardial effusion/thickening. Atherosclerotic nonaneurysmal thoracic aorta. Normal caliber pulmonary arteries. No central pulmonary emboli. Mediastinum/Nodes: Subcentimeter hypodense right thyroid nodule, stable. Unremarkable esophagus. No pathologically enlarged axillary, mediastinal or hilar lymph nodes. Lungs/Pleura: No pneumothorax. No pleural effusion. Moderate centrilobular emphysema with diffuse bronchial wall thickening. Status post left upper lobectomy. No acute consolidative airspace disease or lung masses. Previously described indistinct 4 mm posterior right middle lobe nodule has resolved. Tiny clustered 2-3 mm centrilobular nodules in the anterior right upper lobe (series 5/image 72) are stable and presumably benign. No new significant pulmonary nodules. Upper abdomen: No acute abnormality. Musculoskeletal: No aggressive appearing focal osseous lesions. Moderate thoracic spondylosis. IMPRESSION: 1. No evidence of local tumor recurrence status post left upper lobectomy. 2. No evidence of metastatic disease in the chest. Previously described indistinct 4 mm posterior right middle lobe nodule has resolved, compatible with resolved inflammatory nodule. 3. Moderate centrilobular emphysema with diffuse bronchial  wall thickening, suggesting COPD. 4. Aortic Atherosclerosis (ICD10-I70.0) and Emphysema (ICD10-J43.9). Electronically Signed   By: Ilona Sorrel M.D.   On: 06/22/2021 15:49      ASSESSMENT AND PLAN:  This is a very pleasant 60 years old white female with stage IB non-small cell lung cancer, moderately differentiated adenocarcinoma status post left upper lobectomy with lymph node dissection in May 2019 under the care of Dr. Roxan Hockey. The patient has been in observation since that time and she is feeling fine today with no concerning complaints. She had repeat CT scan of the chest performed recently.  I personally and independently reviewed the scans and discussed the results with the patient.  Her scan showed no concerning findings for disease recurrence or metastasis. I recommended for her to continue on observation with repeat CT scan of the chest in 1 year. She was advised to call immediately if she has any concerning symptoms in the interval. The patient voices understanding of current disease status and treatment options and is in agreement with the current care plan.  All questions were answered. The patient knows to call the clinic with any problems, questions or concerns. We can certainly see the patient much sooner if necessary.   Disclaimer: This note was dictated with voice recognition software. Similar sounding words can inadvertently be transcribed and may not be corrected upon review.

## 2021-09-22 DIAGNOSIS — Z23 Encounter for immunization: Secondary | ICD-10-CM | POA: Diagnosis not present

## 2021-12-03 DIAGNOSIS — I10 Essential (primary) hypertension: Secondary | ICD-10-CM | POA: Diagnosis not present

## 2022-03-29 DIAGNOSIS — J208 Acute bronchitis due to other specified organisms: Secondary | ICD-10-CM | POA: Diagnosis not present

## 2022-03-29 DIAGNOSIS — B9689 Other specified bacterial agents as the cause of diseases classified elsewhere: Secondary | ICD-10-CM | POA: Diagnosis not present

## 2022-04-16 DIAGNOSIS — H6982 Other specified disorders of Eustachian tube, left ear: Secondary | ICD-10-CM | POA: Diagnosis not present

## 2022-04-23 DIAGNOSIS — H6992 Unspecified Eustachian tube disorder, left ear: Secondary | ICD-10-CM | POA: Diagnosis not present

## 2022-04-23 DIAGNOSIS — J069 Acute upper respiratory infection, unspecified: Secondary | ICD-10-CM | POA: Diagnosis not present

## 2022-06-15 DIAGNOSIS — I10 Essential (primary) hypertension: Secondary | ICD-10-CM | POA: Diagnosis not present

## 2022-06-15 DIAGNOSIS — Z23 Encounter for immunization: Secondary | ICD-10-CM | POA: Diagnosis not present

## 2022-06-15 DIAGNOSIS — I700 Atherosclerosis of aorta: Secondary | ICD-10-CM | POA: Diagnosis not present

## 2022-06-15 DIAGNOSIS — E785 Hyperlipidemia, unspecified: Secondary | ICD-10-CM | POA: Diagnosis not present

## 2022-06-15 DIAGNOSIS — Z0000 Encounter for general adult medical examination without abnormal findings: Secondary | ICD-10-CM | POA: Diagnosis not present

## 2022-06-22 ENCOUNTER — Inpatient Hospital Stay: Payer: BC Managed Care – PPO | Attending: Internal Medicine

## 2022-06-22 ENCOUNTER — Ambulatory Visit (HOSPITAL_COMMUNITY)
Admission: RE | Admit: 2022-06-22 | Discharge: 2022-06-22 | Disposition: A | Discharge status: Home / Self Care | Payer: BC Managed Care – PPO | Source: Ambulatory Visit | Attending: Internal Medicine | Admitting: Internal Medicine

## 2022-06-22 DIAGNOSIS — C3490 Malignant neoplasm of unspecified part of unspecified bronchus or lung: Secondary | ICD-10-CM | POA: Insufficient documentation

## 2022-06-22 DIAGNOSIS — J432 Centrilobular emphysema: Secondary | ICD-10-CM | POA: Diagnosis not present

## 2022-06-22 DIAGNOSIS — C3412 Malignant neoplasm of upper lobe, left bronchus or lung: Secondary | ICD-10-CM | POA: Insufficient documentation

## 2022-06-22 DIAGNOSIS — Z902 Acquired absence of lung [part of]: Secondary | ICD-10-CM | POA: Insufficient documentation

## 2022-06-22 LAB — CMP (CANCER CENTER ONLY)
ALT: 12 U/L (ref 0–44)
AST: 21 U/L (ref 15–41)
Albumin: 4.7 g/dL (ref 3.5–5.0)
Alkaline Phosphatase: 49 U/L (ref 38–126)
Anion gap: 7 (ref 5–15)
BUN: 17 mg/dL (ref 8–23)
CO2: 31 mmol/L (ref 22–32)
Calcium: 9.7 mg/dL (ref 8.9–10.3)
Chloride: 98 mmol/L (ref 98–111)
Creatinine: 0.98 mg/dL (ref 0.44–1.00)
GFR, Estimated: >60 mL/min (ref >60)
Glucose, Bld: 90 mg/dL (ref 70–99)
Potassium: 4.8 mmol/L (ref 3.5–5.1)
Sodium: 136 mmol/L (ref 135–145)
Total Bilirubin: 0.4 mg/dL (ref 0.3–1.2)
Total Protein: 7.5 g/dL (ref 6.5–8.1)

## 2022-06-22 LAB — CBC WITH DIFFERENTIAL (CANCER CENTER ONLY)
Abs Immature Granulocytes: 0.01 10*3/uL (ref 0.00–0.07)
Basophils Absolute: 0 10*3/uL (ref 0.0–0.1)
Basophils Relative: 1 %
Eosinophils Absolute: 0.4 10*3/uL (ref 0.0–0.5)
Eosinophils Relative: 7 %
HCT: 36.8 % (ref 36.0–46.0)
Hemoglobin: 12.3 g/dL (ref 12.0–15.0)
Immature Granulocytes: 0 %
Lymphocytes Relative: 37 %
Lymphs Abs: 1.9 10*3/uL (ref 0.7–4.0)
MCH: 31.9 pg (ref 26.0–34.0)
MCHC: 33.4 g/dL (ref 30.0–36.0)
MCV: 95.6 fL (ref 80.0–100.0)
Monocytes Absolute: 0.4 10*3/uL (ref 0.1–1.0)
Monocytes Relative: 9 %
Neutro Abs: 2.4 10*3/uL (ref 1.7–7.7)
Neutrophils Relative %: 46 %
Platelet Count: 308 10*3/uL (ref 150–400)
RBC: 3.85 MIL/uL — ABNORMAL LOW (ref 3.87–5.11)
RDW: 13.5 % (ref 11.5–15.5)
WBC Count: 5.2 10*3/uL (ref 4.0–10.5)
nRBC: 0 % (ref 0.0–0.2)

## 2022-06-22 MED ORDER — IOHEXOL 300 MG/ML  SOLN
75.0000 mL | Freq: Once | INTRAMUSCULAR | Status: AC | PRN
  Administered 2022-06-22: 75 mL via INTRAVENOUS

## 2022-06-22 MED ORDER — IOHEXOL 300 MG/ML  SOLN: 100.0000 mL | Freq: Once | INTRAMUSCULAR | Status: DC | PRN

## 2022-06-24 ENCOUNTER — Inpatient Hospital Stay (HOSPITAL_BASED_OUTPATIENT_CLINIC_OR_DEPARTMENT_OTHER): Payer: BC Managed Care – PPO | Admitting: Internal Medicine

## 2022-06-24 ENCOUNTER — Other Ambulatory Visit: Payer: Self-pay

## 2022-06-24 VITALS — BP 138/69 | HR 82 | Temp 98.0°F | Resp 15 | Wt 111.5 lb

## 2022-06-24 DIAGNOSIS — C3412 Malignant neoplasm of upper lobe, left bronchus or lung: Secondary | ICD-10-CM | POA: Diagnosis not present

## 2022-06-24 DIAGNOSIS — C3490 Malignant neoplasm of unspecified part of unspecified bronchus or lung: Secondary | ICD-10-CM

## 2022-06-24 DIAGNOSIS — Z902 Acquired absence of lung [part of]: Secondary | ICD-10-CM | POA: Diagnosis not present

## 2022-06-24 NOTE — Progress Notes (Signed)
[place] Telephone:([phone]) [phone]   [phone]  OFFICE PROGRESS NOTE  Kristen Loader, [place] [address]  DIAGNOSIS: Stage IB (T2 a, N0, M0) non-small cell lung cancer, moderately differentiated adenocarcinoma  PRIOR THERAPY: Status post left upper lobectomy with lymph node sampling on 02/15/2018 under the care of Dr. Roxan Hockey  CURRENT THERAPY: Observation.  INTERVAL HISTORY: Aimee Lewis 61 y.o. female returns to the clinic today for annual follow-up visit.  The patient is feeling fine today with no concerning complaints.  She had pneumonia few months ago and she was treated for her condition and felt much better.  She denied having any current chest pain, shortness of breath, cough or hemoptysis.  She has no nausea, vomiting, diarrhea or constipation.  She has no headache or visual changes.  She denied having any recent weight loss or night sweats.  She is here today for evaluation with repeat CT scan of the chest for restaging of her disease.   MEDICAL HISTORY: Past Medical History:  Diagnosis Date   Chronic bronchitis (HCC)    COPD (chronic obstructive pulmonary disease) ([facility])    Hypertension    Lung cancer ([facility]) dx'd 01/20/2018   Pneumonia    "at least once" (02/21/2018)   Pneumothorax 02/21/2018    ALLERGIES:  is allergic to codeine and darvon [propoxyphene].  MEDICATIONS:  Current Outpatient Medications  Medication Sig Dispense Refill   Fluticasone-Salmeterol (ADVAIR) 100-50 MCG/DOSE AEPB Inhale 1 puff into the lungs daily as needed (shortness of breath).     ibuprofen (ADVIL,MOTRIN) 200 MG tablet Take 400 mg by mouth daily as needed for headache or moderate pain.      lisinopril (PRINIVIL,ZESTRIL) 40 MG tablet Take 40 mg by mouth daily.     pravastatin (PRAVACHOL) 40 MG tablet Take 40 mg by mouth daily.     No current facility-administered medications for this visit.    SURGICAL HISTORY:  Past Surgical  History:  Procedure Laterality Date   BACK SURGERY     CATARACT EXTRACTION W/ INTRAOCULAR LENS  IMPLANT, BILATERAL Bilateral 09/2017   CHEST TUBE INSERTION Left 02/15/2018; 02/21/2018   LUMBAR DISC SURGERY     TONSILLECTOMY     VIDEO ASSISTED THORACOSCOPY (VATS)/ LOBECTOMY Left 02/15/2018   Procedure: VIDEO ASSISTED THORACOSCOPY (VATS)/LEFT UPPER LOBECTOMY;  Surgeon: Melrose Nakayama, MD;  Location: [facility];  Service: Thoracic;  Laterality: Left;   VIDEO BRONCHOSCOPY WITH ENDOBRONCHIAL NAVIGATION N/A 01/18/2018   Procedure: VIDEO BRONCHOSCOPY WITH ENDOBRONCHIAL NAVIGATION WITH BIOPSIES;  Surgeon: Collene Gobble, MD;  Location: [facility];  Service: Thoracic;  Laterality: N/A;    REVIEW OF SYSTEMS:  A comprehensive review of systems was negative.   PHYSICAL EXAMINATION: General appearance: alert, cooperative, and no distress Head: Normocephalic, without obvious abnormality, atraumatic Neck: no adenopathy, no JVD, supple, symmetrical, trachea midline, and thyroid not enlarged, symmetric, no tenderness/mass/nodules Lymph nodes: Cervical, supraclavicular, and axillary nodes normal. Resp: clear to auscultation bilaterally Back: symmetric, no curvature. ROM normal. No CVA tenderness. Cardio: regular rate and rhythm, S1, S2 normal, no murmur, click, rub or gallop GI: soft, non-tender; bowel sounds normal; no masses,  no organomegaly Extremities: extremities normal, atraumatic, no cyanosis or edema  ECOG PERFORMANCE STATUS: 0 - Asymptomatic  Blood pressure 138/69, pulse 82, temperature 98 F (36.7 C), temperature source Oral, resp. rate 15, weight 111 lb 8 oz (50.6 kg), SpO2 99 %.  LABORATORY DATA: Lab Results  Component Value Date  WBC 5.2 06/22/2022   HGB 12.3 06/22/2022   HCT 36.8 06/22/2022   MCV 95.6 06/22/2022   PLT 308 06/22/2022      Chemistry      Component Value Date/Time   NA 136 06/22/2022 0919   K 4.8 06/22/2022 0919   CL 98 06/22/2022 0919   CO2 31 06/22/2022 0919    BUN 17 06/22/2022 0919   CREATININE 0.98 06/22/2022 0919      Component Value Date/Time   CALCIUM 9.7 06/22/2022 0919   ALKPHOS 49 06/22/2022 0919   AST 21 06/22/2022 0919   ALT 12 06/22/2022 0919   BILITOT 0.4 06/22/2022 0919       RADIOGRAPHIC STUDIES: CT Chest W Contrast  Result Date: 06/23/2022 CLINICAL DATA:  Non-small cell lung cancer restaging * Tracking Code: BO * EXAM: CT CHEST WITH CONTRAST TECHNIQUE: Multidetector CT imaging of the chest was performed during intravenous contrast administration. RADIATION DOSE REDUCTION: This exam was performed according to the departmental dose-optimization program which includes automated exposure control, adjustment of the mA and/or kV according to patient size and/or use of iterative reconstruction technique. CONTRAST:  24mL OMNIPAQUE IOHEXOL 300 MG/ML  SOLN COMPARISON:  Multiple exams, including 06/22/2021 FINDINGS: Cardiovascular: Coronary, aortic arch, and branch vessel atherosclerotic vascular disease. Mediastinum/Nodes: Unremarkable Lungs/Pleura: Left upper lobectomy. Stable right apical pleuroparenchymal scarring. Centrilobular emphysema. Several tiny nodules in the 3 mm diameter range in both lungs are stable from 2020 and considered benign. No significant worrisome or progressive nodule identified. Mild airway thickening bilaterally. Upper Abdomen: Unremarkable Musculoskeletal: Mild thoracic spondylosis. IMPRESSION: 1. No findings of active malignancy. 2. Several tiny nodules in the 3 mm diameter range in both lungs are stable from 2020 and considered benign. 3. Aortic Atherosclerosis (ICD10-I70.0) and Emphysema (ICD10-J43.9). Coronary atherosclerosis. 4. Airway thickening is present, suggesting bronchitis or reactive airways disease. Electronically Signed   By: Van Clines M.D.   On: 06/23/2022 10:09      ASSESSMENT AND PLAN:  This is a very pleasant 61 years old white female with stage IB non-small cell lung cancer, moderately  differentiated adenocarcinoma status post left upper lobectomy with lymph node dissection in May 2019 under the care of Dr. Roxan Hockey. The patient has been on observation since that time and she is feeling fine today with no concerning complaints. She had repeat CT scan of the chest performed recently.  I personally and independently reviewed the scan and discussed the result with the patient today. Her scan showed no concerning findings for disease progression. I recommended for the patient to continue on observation with repeat CT scan of the chest in 1 year. She was advised to call immediately if she has any other concerning symptoms in the interval. The patient voices understanding of current disease status and treatment options and is in agreement with the current care plan.  All questions were answered. The patient knows to call the clinic with any problems, questions or concerns. We can certainly see the patient much sooner if necessary.   Disclaimer: This note was dictated with voice recognition software. Similar sounding words can inadvertently be transcribed and may not be corrected upon review.

## 2022-07-05 ENCOUNTER — Other Ambulatory Visit: Payer: Self-pay | Admitting: Family Medicine

## 2022-07-05 DIAGNOSIS — Z1231 Encounter for screening mammogram for malignant neoplasm of breast: Secondary | ICD-10-CM

## 2022-08-05 ENCOUNTER — Ambulatory Visit: Payer: BC Managed Care – PPO

## 2022-09-22 ENCOUNTER — Ambulatory Visit: Payer: BC Managed Care – PPO

## 2022-10-20 ENCOUNTER — Ambulatory Visit
Admission: RE | Admit: 2022-10-20 | Discharge: 2022-10-20 | Disposition: A | Discharge status: Home / Self Care | Payer: BC Managed Care – PPO | Source: Ambulatory Visit | Attending: Family Medicine | Admitting: Family Medicine

## 2022-10-20 DIAGNOSIS — Z1231 Encounter for screening mammogram for malignant neoplasm of breast: Secondary | ICD-10-CM

## 2022-12-07 DIAGNOSIS — I10 Essential (primary) hypertension: Secondary | ICD-10-CM | POA: Diagnosis not present

## 2023-06-09 ENCOUNTER — Telehealth: Payer: Self-pay | Admitting: Internal Medicine

## 2023-06-09 NOTE — Telephone Encounter (Signed)
Called patient regarding September appointments, left a voicemail.

## 2023-06-20 ENCOUNTER — Inpatient Hospital Stay: Payer: BC Managed Care – PPO | Attending: Internal Medicine

## 2023-06-20 ENCOUNTER — Encounter (HOSPITAL_COMMUNITY): Payer: Self-pay

## 2023-06-20 ENCOUNTER — Ambulatory Visit (HOSPITAL_COMMUNITY)
Admission: RE | Admit: 2023-06-20 | Discharge: 2023-06-20 | Disposition: A | Discharge status: Home / Self Care | Payer: BC Managed Care – PPO | Source: Ambulatory Visit | Attending: Internal Medicine | Admitting: Internal Medicine

## 2023-06-20 DIAGNOSIS — C3490 Malignant neoplasm of unspecified part of unspecified bronchus or lung: Secondary | ICD-10-CM | POA: Diagnosis not present

## 2023-06-20 DIAGNOSIS — Z85118 Personal history of other malignant neoplasm of bronchus and lung: Secondary | ICD-10-CM | POA: Insufficient documentation

## 2023-06-20 DIAGNOSIS — J439 Emphysema, unspecified: Secondary | ICD-10-CM | POA: Diagnosis not present

## 2023-06-20 DIAGNOSIS — I700 Atherosclerosis of aorta: Secondary | ICD-10-CM | POA: Diagnosis not present

## 2023-06-20 LAB — CMP (CANCER CENTER ONLY)
ALT: 13 U/L (ref 0–44)
AST: 23 U/L (ref 15–41)
Albumin: 4.8 g/dL (ref 3.5–5.0)
Alkaline Phosphatase: 45 U/L (ref 38–126)
Anion gap: 7 (ref 5–15)
BUN: 23 mg/dL (ref 8–23)
CO2: 31 mmol/L (ref 22–32)
Calcium: 10.6 mg/dL — ABNORMAL HIGH (ref 8.9–10.3)
Chloride: 99 mmol/L (ref 98–111)
Creatinine: 1.06 mg/dL — ABNORMAL HIGH (ref 0.44–1.00)
GFR, Estimated: 59 mL/min — ABNORMAL LOW (ref >60)
Glucose, Bld: 100 mg/dL — ABNORMAL HIGH (ref 70–99)
Potassium: 5.7 mmol/L — ABNORMAL HIGH (ref 3.5–5.1)
Sodium: 137 mmol/L (ref 135–145)
Total Bilirubin: 0.6 mg/dL (ref 0.3–1.2)
Total Protein: 7.9 g/dL (ref 6.5–8.1)

## 2023-06-20 LAB — CBC WITH DIFFERENTIAL (CANCER CENTER ONLY)
Abs Immature Granulocytes: 0 10*3/uL (ref 0.00–0.07)
Basophils Absolute: 0 10*3/uL (ref 0.0–0.1)
Basophils Relative: 1 %
Eosinophils Absolute: 0.2 10*3/uL (ref 0.0–0.5)
Eosinophils Relative: 3 %
HCT: 39.8 % (ref 36.0–46.0)
Hemoglobin: 13 g/dL (ref 12.0–15.0)
Immature Granulocytes: 0 %
Lymphocytes Relative: 35 %
Lymphs Abs: 1.8 10*3/uL (ref 0.7–4.0)
MCH: 32.3 pg (ref 26.0–34.0)
MCHC: 32.7 g/dL (ref 30.0–36.0)
MCV: 98.8 fL (ref 80.0–100.0)
Monocytes Absolute: 0.5 10*3/uL (ref 0.1–1.0)
Monocytes Relative: 9 %
Neutro Abs: 2.7 10*3/uL (ref 1.7–7.7)
Neutrophils Relative %: 52 %
Platelet Count: 285 10*3/uL (ref 150–400)
RBC: 4.03 MIL/uL (ref 3.87–5.11)
RDW: 12.4 % (ref 11.5–15.5)
WBC Count: 5.2 10*3/uL (ref 4.0–10.5)
nRBC: 0 % (ref 0.0–0.2)

## 2023-06-20 MED ORDER — SODIUM CHLORIDE (PF) 0.9 % IJ SOLN
INTRAMUSCULAR | Status: AC
  Filled 2023-06-20: qty 50

## 2023-06-20 MED ORDER — IOHEXOL 300 MG/ML  SOLN
75.0000 mL | Freq: Once | INTRAMUSCULAR | Status: AC | PRN
  Administered 2023-06-20: 75 mL via INTRAVENOUS

## 2023-06-21 DIAGNOSIS — I10 Essential (primary) hypertension: Secondary | ICD-10-CM | POA: Diagnosis not present

## 2023-06-21 DIAGNOSIS — E785 Hyperlipidemia, unspecified: Secondary | ICD-10-CM | POA: Diagnosis not present

## 2023-06-21 DIAGNOSIS — Z0000 Encounter for general adult medical examination without abnormal findings: Secondary | ICD-10-CM | POA: Diagnosis not present

## 2023-06-21 DIAGNOSIS — I700 Atherosclerosis of aorta: Secondary | ICD-10-CM | POA: Diagnosis not present

## 2023-06-21 DIAGNOSIS — Z23 Encounter for immunization: Secondary | ICD-10-CM | POA: Diagnosis not present

## 2023-06-22 ENCOUNTER — Ambulatory Visit: Payer: BC Managed Care – PPO | Admitting: Internal Medicine

## 2023-06-22 ENCOUNTER — Other Ambulatory Visit: Payer: BC Managed Care – PPO

## 2023-06-30 ENCOUNTER — Inpatient Hospital Stay: Payer: BC Managed Care – PPO | Admitting: Internal Medicine

## 2023-06-30 VITALS — BP 151/69 | HR 76 | Temp 97.7°F | Resp 16 | Ht 62.0 in | Wt 106.3 lb

## 2023-06-30 DIAGNOSIS — Z85118 Personal history of other malignant neoplasm of bronchus and lung: Secondary | ICD-10-CM | POA: Diagnosis not present

## 2023-06-30 DIAGNOSIS — C3490 Malignant neoplasm of unspecified part of unspecified bronchus or lung: Secondary | ICD-10-CM | POA: Diagnosis not present

## 2023-06-30 NOTE — Progress Notes (Signed)
[facility] Telephone:(336) [phone]   Fax:(336) [phone]  OFFICE PROGRESS NOTE  [name], FNP [address] [place] [address]  DIAGNOSIS: Stage IB (T2 a, N0, M0) non-small cell lung cancer, moderately differentiated adenocarcinoma  PRIOR THERAPY: Status post left upper lobectomy with lymph node sampling on 02/15/2018 under the care of Dr. Dorris Fetch  CURRENT THERAPY: Observation.  INTERVAL HISTORY: Aimee Lewis 62 y.o. female returns to the clinic today for annual follow-up visit.Discussed the use of AI scribe software for clinical note transcription with the patient, who gave verbal consent to proceed.  History of Present Illness   Aimee Lewis, a 62 year old patient with a history of Stage 1b non-small cell lung cancer adenocarcinoma, underwent a left upper lobectomy in May 2019. The patient has been on observation since the surgery, with annual follow-ups and scans. Over the past year, she reports no significant changes in health status or new issues.  The patient denies experiencing any new chest pain or breathing issues, except for shortness of breath during humid weather conditions. There is no reported cough or hemoptysis. She also denies any gastrointestinal symptoms such as nausea, vomiting, diarrhea, abdominal pain, or constipation.  The patient has intentionally lost approximately 30 pounds over the last two years as part of a weight management effort. There are no reported changes in vision or headaches.  The patient's diet includes half a cup of cottage cheese daily, but no supplements containing potassium or calcium. Despite this, recent lab results showed elevated levels of potassium and calcium. She denies consumption of foods high in potassium such as bananas or orange juice.  The patient's most recent scan was performed 10 days prior to the consultation, but the results were not yet available at the time of the consultation.  She expressed anxiety over the delay in receiving the scan results.  The patient is under the care of a family care provider Meridee Score, FNP who has been informed of the patient's medical history and current status. She is comfortable with transitioning to the care of their family doctor for future follow-ups.      MEDICAL HISTORY: Past Medical History:  Diagnosis Date   Chronic bronchitis (HCC)    COPD (chronic obstructive pulmonary disease) (HCC)    Hypertension    Lung cancer (HCC) dx'd 01/20/2018   Pneumonia    "at least once" (02/21/2018)   Pneumothorax 02/21/2018    ALLERGIES:  is allergic to codeine and darvon [propoxyphene].  MEDICATIONS:  Current Outpatient Medications  Medication Sig Dispense Refill   Fluticasone-Salmeterol (ADVAIR) 100-50 MCG/DOSE AEPB Inhale 1 puff into the lungs daily as needed (shortness of breath).     ibuprofen (ADVIL,MOTRIN) 200 MG tablet Take 400 mg by mouth daily as needed for headache or moderate pain.      lisinopril (PRINIVIL,ZESTRIL) 40 MG tablet Take 40 mg by mouth daily.     pravastatin (PRAVACHOL) 40 MG tablet Take 40 mg by mouth daily.     No current facility-administered medications for this visit.    SURGICAL HISTORY:  Past Surgical History:  Procedure Laterality Date   BACK SURGERY     CATARACT EXTRACTION W/ INTRAOCULAR LENS  IMPLANT, BILATERAL Bilateral 09/2017   CHEST TUBE INSERTION Left 02/15/2018; 02/21/2018   LUMBAR DISC SURGERY     TONSILLECTOMY     VIDEO ASSISTED THORACOSCOPY (VATS)/ LOBECTOMY Left 02/15/2018   Procedure: VIDEO ASSISTED THORACOSCOPY (VATS)/LEFT UPPER LOBECTOMY;  Surgeon: Loreli Slot, MD;  Location: [facility];  Service: Thoracic;  Laterality: Left;   VIDEO BRONCHOSCOPY WITH ENDOBRONCHIAL NAVIGATION N/A 01/18/2018   Procedure: VIDEO BRONCHOSCOPY WITH ENDOBRONCHIAL NAVIGATION WITH BIOPSIES;  Surgeon: Leslye Peer, MD;  Location: [facility];  Service: Thoracic;  Laterality: N/A;    REVIEW OF SYSTEMS:  A  comprehensive review of systems was negative.   PHYSICAL EXAMINATION: General appearance: alert, cooperative, and no distress Head: Normocephalic, without obvious abnormality, atraumatic Neck: no adenopathy, no JVD, supple, symmetrical, trachea midline, and thyroid not enlarged, symmetric, no tenderness/mass/nodules Lymph nodes: Cervical, supraclavicular, and axillary nodes normal. Resp: clear to auscultation bilaterally Back: symmetric, no curvature. ROM normal. No CVA tenderness. Cardio: regular rate and rhythm, S1, S2 normal, no murmur, click, rub or gallop GI: soft, non-tender; bowel sounds normal; no masses,  no organomegaly Extremities: extremities normal, atraumatic, no cyanosis or edema  ECOG PERFORMANCE STATUS: 0 - Asymptomatic  Blood pressure (!) 151/69, pulse 76, temperature 97.7 F (36.5 C), temperature source Oral, resp. rate 16, height 5\' 2"  (1.575 m), weight 106 lb 4.8 oz (48.2 kg), SpO2 100%.  LABORATORY DATA: Lab Results  Component Value Date   WBC 5.2 06/20/2023   HGB 13.0 06/20/2023   HCT 39.8 06/20/2023   MCV 98.8 06/20/2023   PLT 285 06/20/2023      Chemistry      Component Value Date/Time   NA 137 06/20/2023 0931   K 5.7 (H) 06/20/2023 0931   CL 99 06/20/2023 0931   CO2 31 06/20/2023 0931   BUN 23 06/20/2023 0931   CREATININE 1.06 (H) 06/20/2023 0931      Component Value Date/Time   CALCIUM 10.6 (H) 06/20/2023 0931   ALKPHOS 45 06/20/2023 0931   AST 23 06/20/2023 0931   ALT 13 06/20/2023 0931   BILITOT 0.6 06/20/2023 0931       RADIOGRAPHIC STUDIES: CT Chest W Contrast  Result Date: 06/30/2023 CLINICAL DATA:  Non-small-cell lung cancer. Restaging. * Tracking Code: BO * EXAM: CT CHEST WITH CONTRAST TECHNIQUE: Multidetector CT imaging of the chest was performed during intravenous contrast administration. RADIATION DOSE REDUCTION: This exam was performed according to the departmental dose-optimization program which includes automated exposure  control, adjustment of the mA and/or kV according to patient size and/or use of iterative reconstruction technique. CONTRAST:  75mL OMNIPAQUE IOHEXOL 300 MG/ML  SOLN COMPARISON:  06/22/2022 FINDINGS: Cardiovascular: The heart size is normal. No substantial pericardial effusion. Coronary artery calcification is evident. Mild atherosclerotic calcification is noted in the wall of the thoracic aorta. Mediastinum/Nodes: No mediastinal lymphadenopathy. There is no hilar lymphadenopathy. The esophagus has normal imaging features. There is no axillary lymphadenopathy. Lungs/Pleura: Volume loss left hemithorax compatible with prior left upper lobectomy. Centrilobular emphsyema noted. Apical scarring on the right is stable. Small cluster of micro nodularity in the anterior right upper lobe is stable, likely sequelae of atypical infection 5 mm right lower lobe nodule on 89/5 is new in the interval. No new suspicious pulmonary nodule or mass in the left lung. No focal airspace consolidation. No pleural effusion. Upper Abdomen: Stable subcapsular hemangioma in the anterior right liver (171/2). Musculoskeletal: No worrisome lytic or sclerotic osseous abnormality. IMPRESSION: 1. Status post left upper lobectomy without evidence for local recurrence. 2. New 5 mm right lower lobe pulmonary nodule. Given the history of lung cancer, follow-up CT in 3 months recommended to ensure stability. 3. Aortic Atherosclerosis (ICD10-I70.0) and Emphysema (ICD10-J43.9). Electronically Signed   By: Kennith Center M.D.   On: 06/30/2023 15:50  ASSESSMENT AND PLAN:  This is a very pleasant 62 years old white female with stage IB non-small cell lung cancer, moderately differentiated adenocarcinoma status post left upper lobectomy with lymph node dissection in May 2019 under the care of Dr. Dorris Fetch.  The patient has been on observation since that time and she is feeling fine.    Stage 1b Non-Small Cell Lung Cancer (Adenocarcinoma) Status  post left upper lobectomy in May 2019. No new chest pain, cough, or hemoptysis. Shortness of breath in humid weather. Recent CT scan pending official radiology read, but preliminary review by oncologist did not reveal any concerning changes. - Await official radiology report. If negative, patient can be released from oncology follow-up and transition to annual chest x-rays with primary care provider.  - The scan results became available after the patient left the office and that showed new 5 mm right lower lobe pulmonary nodule. Given the history of lung cancer, follow-up CT in 3 months recommended to ensure stability. - I called the patient and updated her about the CT scan result and we will schedule CT scan of the chest in 3 months with follow-up visit.  Hyperkalemia and Hypercalcemia Recent labs showed elevated potassium (5.7) and calcium (10.6). Patient reports no use of supplements but consumes half a cup of cottage cheese daily. - Advise patient to cut back on foods high in potassium. - Monitor potassium and calcium levels.  Weight Loss Patient reports intentional weight loss of approximately 30 pounds over the past two years. - Encourage continued healthy diet and weight management.  Hypertension Elevated blood pressure noted during visit, possibly due to anxiety about scan results. - Monitor blood pressure.    The patient was advised to call immediately if she has any concerning symptoms in the interval. The patient voices understanding of current disease status and treatment options and is in agreement with the current care plan.  All questions were answered. The patient knows to call the clinic with any problems, questions or concerns. We can certainly see the patient much sooner if necessary.   Disclaimer: This note was dictated with voice recognition software. Similar sounding words can inadvertently be transcribed and may not be corrected upon review.

## 2023-07-04 ENCOUNTER — Telehealth: Payer: Self-pay | Admitting: Internal Medicine

## 2023-07-04 NOTE — Telephone Encounter (Signed)
per IB message 07/04/23 Patient requested to change her scan appointment and her lab and office visit with Dr. Shirline Frees. I advised patient to call central Radiology to reschedule her scan and then give me a call so I can reschedule her lab and office visit

## 2023-07-06 NOTE — Telephone Encounter (Signed)
07/06/23; this encounter is a duplicate, created in error

## 2023-07-06 NOTE — Telephone Encounter (Signed)
Per IB 07/04/23; the patient rescheduled the scan and we rescheduled the lab and follow up visit. Patient is aware of the appointment details.

## 2023-09-05 ENCOUNTER — Other Ambulatory Visit: Payer: Self-pay | Admitting: Physician Assistant

## 2023-09-05 ENCOUNTER — Telehealth: Payer: Self-pay

## 2023-09-05 ENCOUNTER — Ambulatory Visit (HOSPITAL_COMMUNITY)
Admission: RE | Admit: 2023-09-05 | Discharge: 2023-09-05 | Disposition: A | Discharge status: Home / Self Care | Payer: BC Managed Care – PPO | Source: Ambulatory Visit | Attending: Internal Medicine | Admitting: Internal Medicine

## 2023-09-05 ENCOUNTER — Inpatient Hospital Stay: Payer: BC Managed Care – PPO | Attending: Internal Medicine

## 2023-09-05 DIAGNOSIS — E875 Hyperkalemia: Secondary | ICD-10-CM

## 2023-09-05 DIAGNOSIS — R911 Solitary pulmonary nodule: Secondary | ICD-10-CM | POA: Insufficient documentation

## 2023-09-05 DIAGNOSIS — C3490 Malignant neoplasm of unspecified part of unspecified bronchus or lung: Secondary | ICD-10-CM | POA: Diagnosis not present

## 2023-09-05 DIAGNOSIS — I700 Atherosclerosis of aorta: Secondary | ICD-10-CM | POA: Diagnosis not present

## 2023-09-05 DIAGNOSIS — Z85118 Personal history of other malignant neoplasm of bronchus and lung: Secondary | ICD-10-CM | POA: Insufficient documentation

## 2023-09-05 DIAGNOSIS — Z08 Encounter for follow-up examination after completed treatment for malignant neoplasm: Secondary | ICD-10-CM | POA: Insufficient documentation

## 2023-09-05 DIAGNOSIS — J439 Emphysema, unspecified: Secondary | ICD-10-CM | POA: Diagnosis not present

## 2023-09-05 LAB — CMP (CANCER CENTER ONLY)
ALT: 25 U/L (ref 0–44)
AST: 31 U/L (ref 15–41)
Albumin: 4.8 g/dL (ref 3.5–5.0)
Alkaline Phosphatase: 47 U/L (ref 38–126)
Anion gap: 7 (ref 5–15)
BUN: 26 mg/dL — ABNORMAL HIGH (ref 8–23)
CO2: 30 mmol/L (ref 22–32)
Calcium: 10.3 mg/dL (ref 8.9–10.3)
Chloride: 100 mmol/L (ref 98–111)
Creatinine: 1.05 mg/dL — ABNORMAL HIGH (ref 0.44–1.00)
GFR, Estimated: 60 mL/min — ABNORMAL LOW (ref >60)
Glucose, Bld: 90 mg/dL (ref 70–99)
Potassium: 5.6 mmol/L — ABNORMAL HIGH (ref 3.5–5.1)
Sodium: 137 mmol/L (ref 135–145)
Total Bilirubin: 0.5 mg/dL (ref <1.2)
Total Protein: 7.5 g/dL (ref 6.5–8.1)

## 2023-09-05 LAB — CBC WITH DIFFERENTIAL (CANCER CENTER ONLY)
Abs Immature Granulocytes: 0.02 10*3/uL (ref 0.00–0.07)
Basophils Absolute: 0 10*3/uL (ref 0.0–0.1)
Basophils Relative: 1 %
Eosinophils Absolute: 0.2 10*3/uL (ref 0.0–0.5)
Eosinophils Relative: 2 %
HCT: 38.7 % (ref 36.0–46.0)
Hemoglobin: 12.7 g/dL (ref 12.0–15.0)
Immature Granulocytes: 0 %
Lymphocytes Relative: 25 %
Lymphs Abs: 1.9 10*3/uL (ref 0.7–4.0)
MCH: 32.4 pg (ref 26.0–34.0)
MCHC: 32.8 g/dL (ref 30.0–36.0)
MCV: 98.7 fL (ref 80.0–100.0)
Monocytes Absolute: 0.6 10*3/uL (ref 0.1–1.0)
Monocytes Relative: 8 %
Neutro Abs: 5 10*3/uL (ref 1.7–7.7)
Neutrophils Relative %: 64 %
Platelet Count: 298 10*3/uL (ref 150–400)
RBC: 3.92 MIL/uL (ref 3.87–5.11)
RDW: 12.9 % (ref 11.5–15.5)
WBC Count: 7.7 10*3/uL (ref 4.0–10.5)
nRBC: 0 % (ref 0.0–0.2)

## 2023-09-05 MED ORDER — IOHEXOL 300 MG/ML  SOLN
75.0000 mL | Freq: Once | INTRAMUSCULAR | Status: AC | PRN
  Administered 2023-09-05: 75 mL via INTRAVENOUS

## 2023-09-05 NOTE — Telephone Encounter (Signed)
Spoke with patient in regards to elevated potassium.  Per Cassie, PA- Make sure she is not taking potassium supplements, multivitamins or eating excessive amounts of potassium rich food. See if she has ever taken kayexelate before. Recommend to recheck her labs to confirm potassium twice before sending in [place]. If potassium is still high tomorrow, will send in [place].   Informed pt not to eat excessive amounts of potassium rich foods (gave pt a list), Pt states she does not take potassium or multivitamin supplement. Pt states that she has never took Radiographer, therapeutic before. Informed pt to recheck BMP tomorrow.  Lab appt made. Informed pt to follow up with PCP as well.

## 2023-09-06 ENCOUNTER — Telehealth: Payer: Self-pay | Admitting: Medical Oncology

## 2023-09-06 ENCOUNTER — Other Ambulatory Visit: Payer: BC Managed Care – PPO

## 2023-09-06 DIAGNOSIS — E875 Hyperkalemia: Secondary | ICD-10-CM

## 2023-09-06 DIAGNOSIS — R911 Solitary pulmonary nodule: Secondary | ICD-10-CM | POA: Diagnosis not present

## 2023-09-06 DIAGNOSIS — Z08 Encounter for follow-up examination after completed treatment for malignant neoplasm: Secondary | ICD-10-CM | POA: Diagnosis not present

## 2023-09-06 DIAGNOSIS — Z85118 Personal history of other malignant neoplasm of bronchus and lung: Secondary | ICD-10-CM | POA: Diagnosis not present

## 2023-09-06 LAB — BASIC METABOLIC PANEL - CANCER CENTER ONLY
Anion gap: 8 (ref 5–15)
BUN: 21 mg/dL (ref 8–23)
CO2: 28 mmol/L (ref 22–32)
Calcium: 10.3 mg/dL (ref 8.9–10.3)
Chloride: 100 mmol/L (ref 98–111)
Creatinine: 1.05 mg/dL — ABNORMAL HIGH (ref 0.44–1.00)
GFR, Estimated: 60 mL/min — ABNORMAL LOW (ref >60)
Glucose, Bld: 84 mg/dL (ref 70–99)
Potassium: 4.8 mmol/L (ref 3.5–5.1)
Sodium: 136 mmol/L (ref 135–145)

## 2023-09-06 NOTE — Telephone Encounter (Signed)
Per Cassie , I told pt that her potassium is better today. No need for kayexelate.

## 2023-09-12 ENCOUNTER — Inpatient Hospital Stay (HOSPITAL_BASED_OUTPATIENT_CLINIC_OR_DEPARTMENT_OTHER): Payer: BC Managed Care – PPO | Admitting: Internal Medicine

## 2023-09-12 VITALS — BP 126/71 | HR 85 | Temp 97.3°F | Resp 16 | Ht 62.0 in | Wt 105.2 lb

## 2023-09-12 DIAGNOSIS — Z08 Encounter for follow-up examination after completed treatment for malignant neoplasm: Secondary | ICD-10-CM | POA: Diagnosis not present

## 2023-09-12 DIAGNOSIS — C3490 Malignant neoplasm of unspecified part of unspecified bronchus or lung: Secondary | ICD-10-CM

## 2023-09-12 DIAGNOSIS — Z85118 Personal history of other malignant neoplasm of bronchus and lung: Secondary | ICD-10-CM | POA: Diagnosis not present

## 2023-09-12 DIAGNOSIS — R911 Solitary pulmonary nodule: Secondary | ICD-10-CM | POA: Diagnosis not present

## 2023-09-12 NOTE — Addendum Note (Signed)
Addended by: Charma Igo on: 09/12/2023 09:23 AM   Modules accepted: Orders

## 2023-09-12 NOTE — Progress Notes (Signed)
[facility] [phone]   Fax:([phone]) [phone]  OFFICE PROGRESS NOTE  [name], FNP [phone] [address] [place] [address]  DIAGNOSIS: Stage IB (T2 a, N0, M0) non-small cell lung cancer, moderately differentiated adenocarcinoma  PRIOR THERAPY: Status post left upper lobectomy with lymph node sampling on 02/15/2018 under the care of Dr. Dorris Fetch  CURRENT THERAPY: Observation.  INTERVAL HISTORY: Aimee Lewis 62 y.o. female returns to the clinic today for annual follow-up visit by her husband.Discussed the use of AI scribe software for clinical note transcription with the patient, who gave verbal consent to proceed.  History of Present Illness   The patient, a 62 year old individual with a history of stage 1B lung cancer diagnosed in May 2019, underwent an upper lobectomy at the time of diagnosis. She has been under regular surveillance with periodic scans since then. In September, a small nodule was identified in the right lung, causing concern. The most recent scan revealed that the nodule is still present and has slightly increased in size, measuring approximately 6x5 millimeters. However, due to the nodule's small size, these procedures may not yield conclusive results. She also reported a recent negative experience with IV contrast during her last scan, resulting in multiple attempts to establish IV access. The patient expressed a preference for future scans to be performed without contrast, if possible.       MEDICAL HISTORY: Past Medical History:  Diagnosis Date   Chronic bronchitis (HCC)    COPD (chronic obstructive pulmonary disease) (HCC)    Hypertension    Lung cancer (HCC) dx'd 01/20/2018   Pneumonia    "at least once" (02/21/2018)   Pneumothorax 02/21/2018    ALLERGIES:  is allergic to codeine and darvon [propoxyphene].  MEDICATIONS:  Current Outpatient Medications  Medication Sig Dispense Refill    Fluticasone-Salmeterol (ADVAIR) 100-50 MCG/DOSE AEPB Inhale 1 puff into the lungs daily as needed (shortness of breath).     ibuprofen (ADVIL,MOTRIN) 200 MG tablet Take 400 mg by mouth daily as needed for headache or moderate pain.      lisinopril (PRINIVIL,ZESTRIL) 40 MG tablet Take 40 mg by mouth daily.     pravastatin (PRAVACHOL) 40 MG tablet Take 40 mg by mouth daily.     No current facility-administered medications for this visit.    SURGICAL HISTORY:  Past Surgical History:  Procedure Laterality Date   BACK SURGERY     CATARACT EXTRACTION W/ INTRAOCULAR LENS  IMPLANT, BILATERAL Bilateral 09/2017   CHEST TUBE INSERTION Left 02/15/2018; 02/21/2018   LUMBAR DISC SURGERY     TONSILLECTOMY     VIDEO ASSISTED THORACOSCOPY (VATS)/ LOBECTOMY Left 02/15/2018   Procedure: VIDEO ASSISTED THORACOSCOPY (VATS)/LEFT UPPER LOBECTOMY;  Surgeon: Loreli Slot, MD;  Location: [facility] OR;  Service: Thoracic;  Laterality: Left;   VIDEO BRONCHOSCOPY WITH ENDOBRONCHIAL NAVIGATION N/A 01/18/2018   Procedure: VIDEO BRONCHOSCOPY WITH ENDOBRONCHIAL NAVIGATION WITH BIOPSIES;  Surgeon: Leslye Peer, MD;  Location: [facility] OR;  Service: Thoracic;  Laterality: N/A;    REVIEW OF SYSTEMS:  A comprehensive review of systems was negative.   PHYSICAL EXAMINATION: General appearance: alert, cooperative, and no distress Head: Normocephalic, without obvious abnormality, atraumatic Neck: no adenopathy, no JVD, supple, symmetrical, trachea midline, and thyroid not enlarged, symmetric, no tenderness/mass/nodules Lymph nodes: Cervical, supraclavicular, and axillary nodes normal. Resp: clear to auscultation bilaterally Back: symmetric, no curvature. ROM normal. No CVA tenderness. Cardio: regular rate and rhythm, S1, S2 normal, no murmur,  click, rub or gallop GI: soft, non-tender; bowel sounds normal; no masses,  no organomegaly Extremities: extremities normal, atraumatic, no cyanosis or edema  ECOG PERFORMANCE STATUS: 0 -  Asymptomatic  Blood pressure 126/71, pulse 85, temperature (!) 97.3 F (36.3 C), temperature source Temporal, resp. rate 16, height 5\' 2"  (1.575 m), weight 105 lb 3.2 oz (47.7 kg), SpO2 100%.  LABORATORY DATA: Lab Results  Component Value Date   WBC 7.7 09/05/2023   HGB 12.7 09/05/2023   HCT 38.7 09/05/2023   MCV 98.7 09/05/2023   PLT 298 09/05/2023      Chemistry      Component Value Date/Time   NA 136 09/06/2023 0918   K 4.8 09/06/2023 0918   CL 100 09/06/2023 0918   CO2 28 09/06/2023 0918   BUN 21 09/06/2023 0918   CREATININE 1.05 (H) 09/06/2023 0918      Component Value Date/Time   CALCIUM 10.3 09/06/2023 0918   ALKPHOS 47 09/05/2023 0926   AST 31 09/05/2023 0926   ALT 25 09/05/2023 0926   BILITOT 0.5 09/05/2023 0926       RADIOGRAPHIC STUDIES: CT Chest W Contrast  Result Date: 09/09/2023 CLINICAL DATA:  Non-small cell lung cancer, follow-up EXAM: CT CHEST WITH CONTRAST TECHNIQUE: Multidetector CT imaging of the chest was performed during intravenous contrast administration. RADIATION DOSE REDUCTION: This exam was performed according to the departmental dose-optimization program which includes automated exposure control, adjustment of the mA and/or kV according to patient size and/or use of iterative reconstruction technique. CONTRAST:  75mL OMNIPAQUE IOHEXOL 300 MG/ML  SOLN COMPARISON:  06/20/2023 FINDINGS: Cardiovascular: The heart is normal in size. No pericardial effusion. No evidence of thoracic aortic aneurysm. Atherosclerotic calcifications of the aortic arch. Moderate three-vessel coronary atherosclerosis. Mediastinum/Nodes: No suspicious mediastinal lymphadenopathy. Visualized thyroid is unremarkable. Lungs/Pleura: Status post left upper lobectomy. Mild centrilobular and paraseptal emphysematous changes, upper lung predominant Mild right apical pleural-parenchymal scarring. Cystic lesion in the central right lower lobe with adjacent 6 x 5 mm mural nodule (series  5/image 105), previously 3 x 5 mm this appearance raises concern for early primary bronchogenic carcinoma. Additional scattered mild peribronchovascular nodularity in the right upper lobe (series 5/image 71), unchanged, benign. No focal consolidation. No pleural effusion or pneumothorax. Upper Abdomen: Visualized upper abdomen is grossly unremarkable, noting vascular calcifications. Musculoskeletal: Degenerative changes of the lumbar spine. IMPRESSION: Status post left upper lobectomy. No evidence of metastatic disease. However, there is a cystic lesion in the central right lower lobe with adjacent 6 x 5 mm mural nodule, progressive, concerning for early primary bronchogenic carcinoma. Aortic Atherosclerosis (ICD10-I70.0) and Emphysema (ICD10-J43.9). Electronically Signed   By: Charline Bills M.D.   On: 09/09/2023 21:52      ASSESSMENT AND PLAN:  This is a very pleasant 62 years old white female with stage IB non-small cell lung cancer, moderately differentiated adenocarcinoma status post left upper lobectomy with lymph node dissection in May 2019 under the care of Dr. Dorris Fetch.  The patient has been on observation since that time and she is feeling fine.    Pulmonary Nodule A 6x5 mm nodule in the right lung identified in September 2024 has slightly increased in size but remains below the detection limit for a PET scan and is too small for a conclusive biopsy. Differential diagnosis includes early-stage lung cancer. Discussed stereotactic radiotherapy, involving targeted x-ray treatment over three sessions. Risks of unnecessary PET scan and biopsy, including non-conclusive results and procedural complications, were discussed. Patient prefers to avoid  contrast due to previous IV access difficulties and scarring. - Repeat CT scan in three months without contrast - Schedule CT scan at least seven days before follow-up visit - Consider stereotactic radiotherapy if nodule increases to 10 mm or more -  Discuss stereotactic radiotherapy with radiation oncologist if necessary  Follow-up - Schedule follow-up appointment in three months - Ensure patient receives a call to schedule CT scan two weeks before follow-up - Provide central scheduling number for appointment issues.   The patient was advised to call immediately if she has any concerning symptoms in the interval.  The patient voices understanding of current disease status and treatment options and is in agreement with the current care plan.  All questions were answered. The patient knows to call the clinic with any problems, questions or concerns. We can certainly see the patient much sooner if necessary.   Disclaimer: This note was dictated with voice recognition software. Similar sounding words can inadvertently be transcribed and may not be corrected upon review.

## 2023-09-29 ENCOUNTER — Other Ambulatory Visit: Payer: BC Managed Care – PPO

## 2023-10-13 ENCOUNTER — Ambulatory Visit: Payer: BC Managed Care – PPO | Admitting: Internal Medicine

## 2023-12-05 ENCOUNTER — Ambulatory Visit (HOSPITAL_COMMUNITY)
Admission: RE | Admit: 2023-12-05 | Discharge: 2023-12-05 | Disposition: A | Discharge status: Home / Self Care | Payer: BC Managed Care – PPO | Source: Ambulatory Visit | Attending: Internal Medicine | Admitting: Internal Medicine

## 2023-12-05 DIAGNOSIS — C3490 Malignant neoplasm of unspecified part of unspecified bronchus or lung: Secondary | ICD-10-CM | POA: Insufficient documentation

## 2023-12-13 ENCOUNTER — Inpatient Hospital Stay: Payer: BC Managed Care – PPO | Attending: Internal Medicine | Admitting: Internal Medicine

## 2023-12-13 VITALS — BP 117/73 | HR 84 | Temp 97.8°F | Resp 16 | Ht 62.0 in | Wt 104.3 lb

## 2023-12-13 DIAGNOSIS — Z85118 Personal history of other malignant neoplasm of bronchus and lung: Secondary | ICD-10-CM | POA: Diagnosis present

## 2023-12-13 DIAGNOSIS — C3492 Malignant neoplasm of unspecified part of left bronchus or lung: Secondary | ICD-10-CM

## 2023-12-13 DIAGNOSIS — Z902 Acquired absence of lung [part of]: Secondary | ICD-10-CM | POA: Insufficient documentation

## 2023-12-13 NOTE — Progress Notes (Signed)
 [facility] Telephone:([phone]   Fax:(336) [phone]  OFFICE PROGRESS NOTE  Aimee Pilon, FNP [phone] [address] [place] [address]  DIAGNOSIS: Stage IB (T2 a, N0, M0) non-small cell lung cancer, moderately differentiated adenocarcinoma  PRIOR THERAPY: Status post left upper lobectomy with lymph node sampling on 02/15/2018 under the care of Dr. Dorris Fetch  CURRENT THERAPY: Observation.  INTERVAL HISTORY: Aimee Lewis 63 y.o. female returns to the clinic today for annual follow-up visit by her husband. Discussed the use of AI scribe software for clinical note transcription with the patient, who gave verbal consent to proceed.  History of Present Illness   The patient is a 63 year old with stage 1B non-small cell lung cancer adenocarcinoma who presents for follow-up. She is accompanied by her husband and wife.  She was diagnosed with stage 1B non-small cell lung cancer adenocarcinoma in May 2019 and underwent a left upper lobectomy at that time. Since then, she has been on observation for nearly six years.  In December, a suspicious cystic lesion was noted in the right lower lobe, raising concerns for tumor recurrence. A follow-up scan was performed, and she has had no new symptoms such as chest pain, breathing issues, cough, weight loss, or night sweats since the last visit.       MEDICAL HISTORY: Past Medical History:  Diagnosis Date   Chronic bronchitis (HCC)    COPD (chronic obstructive pulmonary disease) (HCC)    Hypertension    Lung cancer (HCC) dx'd 01/20/2018   Pneumonia    "at least once" (02/21/2018)   Pneumothorax 02/21/2018    ALLERGIES:  is allergic to codeine and darvon [propoxyphene].  MEDICATIONS:  Current Outpatient Medications  Medication Sig Dispense Refill   DENTA 5000 PLUS 1.1 % CREA dental cream Take 1 Application by mouth as directed.     Fluticasone-Salmeterol (ADVAIR) 100-50 MCG/DOSE AEPB Inhale 1 puff  into the lungs daily as needed (shortness of breath).     ibuprofen (ADVIL,MOTRIN) 200 MG tablet Take 400 mg by mouth daily as needed for headache or moderate pain.      lisinopril (PRINIVIL,ZESTRIL) 40 MG tablet Take 40 mg by mouth daily.     pravastatin (PRAVACHOL) 40 MG tablet Take 40 mg by mouth daily.     No current facility-administered medications for this visit.    SURGICAL HISTORY:  Past Surgical History:  Procedure Laterality Date   BACK SURGERY     CATARACT EXTRACTION W/ INTRAOCULAR LENS  IMPLANT, BILATERAL Bilateral 09/2017   CHEST TUBE INSERTION Left 02/15/2018; 02/21/2018   LUMBAR DISC SURGERY     TONSILLECTOMY     VIDEO ASSISTED THORACOSCOPY (VATS)/ LOBECTOMY Left 02/15/2018   Procedure: VIDEO ASSISTED THORACOSCOPY (VATS)/LEFT UPPER LOBECTOMY;  Surgeon: Loreli Slot, MD;  Location: [facility] OR;  Service: Thoracic;  Laterality: Left;   VIDEO BRONCHOSCOPY WITH ENDOBRONCHIAL NAVIGATION N/A 01/18/2018   Procedure: VIDEO BRONCHOSCOPY WITH ENDOBRONCHIAL NAVIGATION WITH BIOPSIES;  Surgeon: Leslye Peer, MD;  Location: [facility] OR;  Service: Thoracic;  Laterality: N/A;    REVIEW OF SYSTEMS:  A comprehensive review of systems was negative.   PHYSICAL EXAMINATION: General appearance: alert, cooperative, and no distress Head: Normocephalic, without obvious abnormality, atraumatic Neck: no adenopathy, no JVD, supple, symmetrical, trachea midline, and thyroid not enlarged, symmetric, no tenderness/mass/nodules Lymph nodes: Cervical, supraclavicular, and axillary nodes normal. Resp: clear to auscultation bilaterally Back: symmetric, no curvature. ROM normal. No CVA tenderness. Cardio: regular rate  and rhythm, S1, S2 normal, no murmur, click, rub or gallop GI: soft, non-tender; bowel sounds normal; no masses,  no organomegaly Extremities: extremities normal, atraumatic, no cyanosis or edema  ECOG PERFORMANCE STATUS: 0 - Asymptomatic  Blood pressure 117/73, pulse 84, temperature 97.8  F (36.6 C), temperature source Temporal, resp. rate 16, height 5\' 2"  (1.575 m), weight 104 lb 4.8 oz (47.3 kg), SpO2 100%.  LABORATORY DATA: Lab Results  Component Value Date   WBC 7.7 09/05/2023   HGB 12.7 09/05/2023   HCT 38.7 09/05/2023   MCV 98.7 09/05/2023   PLT 298 09/05/2023      Chemistry      Component Value Date/Time   NA 136 09/06/2023 0918   K 4.8 09/06/2023 0918   CL 100 09/06/2023 0918   CO2 28 09/06/2023 0918   BUN 21 09/06/2023 0918   CREATININE 1.05 (H) 09/06/2023 0918      Component Value Date/Time   CALCIUM 10.3 09/06/2023 0918   ALKPHOS 47 09/05/2023 0926   AST 31 09/05/2023 0926   ALT 25 09/05/2023 0926   BILITOT 0.5 09/05/2023 0926       RADIOGRAPHIC STUDIES: CT Chest Wo Contrast Result Date: 12/12/2023 CLINICAL DATA:  Restaging non-small cell lung cancer. * Tracking Code: BO * EXAM: CT CHEST WITHOUT CONTRAST TECHNIQUE: Multidetector CT imaging of the chest was performed following the standard protocol without IV contrast. RADIATION DOSE REDUCTION: This exam was performed according to the departmental dose-optimization program which includes automated exposure control, adjustment of the mA and/or kV according to patient size and/or use of iterative reconstruction technique. COMPARISON:  Multiple prior imaging studies. The most recent CT scan is 09/05/2023 FINDINGS: Cardiovascular: The heart is normal in size. No pericardial effusion. Stable tortuosity and calcification of the thoracic aorta. Stable three-vessel coronary artery calcifications. Mediastinum/Nodes: The esophagus is unremarkable. The thyroid gland is normal. Lungs/Pleura: Surgical changes from a left upper lobe lobectomy. No findings for recurrent tumor. Stable underlying emphysematous changes and areas of pulmonary scarring. Stable bullous changes in the right lower lobe. On the prior CT scan there was a small nodular lesion adjacent to a central bulla. This has near completely resolved with a  tiny residual nodular component measuring 3 mm. This previously measured 6 mm. This is likely a resolving area inflammation retracting scar tissue. No worrisome pulmonary lesions are identified to suggest metastatic disease or new primary lung cancer. No pleural effusions or pleural lesions. Upper Abdomen: No significant upper abdominal findings. Stable vascular calcifications. Musculoskeletal: Nodular breast tissue but no obvious mass. Recent mammograms were normal. No supraclavicular or axillary adenopathy. The bony thorax is intact. Stable significant scoliosis and associated degenerative changes but no worrisome lytic or sclerotic bone lesions to suggest metastatic disease. IMPRESSION: 1. Surgical changes from a left upper lobe lobectomy. No findings for recurrent tumor or metastatic disease. 2. Near complete resolution of a small nodular lesion adjacent to a central bulla in the right lower lobe. This is likely a resolving area of inflammation or retracting scar tissue. 3. Stable underlying emphysematous changes and areas of pulmonary scarring. 4. Stable three-vessel coronary artery calcifications. Electronically Signed   By: Rudie Meyer M.D.   On: 12/12/2023 18:43      ASSESSMENT AND PLAN:  This is a very pleasant 63 years old white female with stage IB non-small cell lung cancer, moderately differentiated adenocarcinoma status post left upper lobectomy with lymph node dissection in May 2019 under the care of Dr. Dorris Fetch.  The patient  has been on observation since that time and she is feeling fine. She had repeat CT scan of the chest performed recently that showed no concerning findings for disease recurrence or metastasis with near complete resolution of the small nodular lesion adjacent to the central follow-up of the right lower lobe.    Stage IB non-small cell lung cancer adenocarcinoma Diagnosed in May 2019 and treated with left upper lobectomy. She has been under observation for nearly six  years. Recent concerns about a suspicious cystic lesion in the right lower lobe, initially thought to be tumor recurrence, have resolved. The lesion has disappeared, likely due to inflammation, and no suspicious lesions are currently present. She feels great with no symptoms such as chest pain, dyspnea, cough, weight loss, or night sweats. - Discontinue oncology follow-up unless new concerns arise - Follow up with family physician, Dr. Peri Maris - Recommend annual chest x-ray   The patient was advised to call if she has any concerning symptoms. The patient voices understanding of current disease status and treatment options and is in agreement with the current care plan.  All questions were answered. The patient knows to call the clinic with any problems, questions or concerns. We can certainly see the patient much sooner if necessary.   Disclaimer: This note was dictated with voice recognition software. Similar sounding words can inadvertently be transcribed and may not be corrected upon review.
